# Patient Record
Sex: Male | Born: 1975 | Race: White | Hispanic: No | Marital: Married | Smoking: Never smoker
Health system: Southern US, Community
[De-identification: ages and names within clinical notes are randomized; demographics above are authoritative.]

## PROBLEM LIST (undated history)

## (undated) DIAGNOSIS — F410 Panic disorder [episodic paroxysmal anxiety] without agoraphobia: Secondary | ICD-10-CM

## (undated) DIAGNOSIS — Z87442 Personal history of urinary calculi: Secondary | ICD-10-CM

## (undated) DIAGNOSIS — N451 Epididymitis: Secondary | ICD-10-CM

## (undated) DIAGNOSIS — N200 Calculus of kidney: Secondary | ICD-10-CM

## (undated) HISTORY — DX: Calculus of kidney: N20.0

## (undated) HISTORY — PX: WISDOM TOOTH EXTRACTION: SHX21

## (undated) HISTORY — PX: COLONOSCOPY: SHX174

## (undated) HISTORY — DX: Epididymitis: N45.1

## (undated) HISTORY — PX: VASECTOMY: SHX75

## (undated) HISTORY — DX: Panic disorder (episodic paroxysmal anxiety): F41.0

## (undated) HISTORY — PX: POLYPECTOMY: SHX149

---

## 2015-07-06 ENCOUNTER — Other Ambulatory Visit: Payer: Self-pay | Admitting: Internal Medicine

## 2015-07-06 DIAGNOSIS — R079 Chest pain, unspecified: Secondary | ICD-10-CM

## 2015-07-06 DIAGNOSIS — R1031 Right lower quadrant pain: Secondary | ICD-10-CM

## 2015-07-08 ENCOUNTER — Ambulatory Visit
Admission: RE | Admit: 2015-07-08 | Discharge: 2015-07-08 | Disposition: A | Discharge status: Home / Self Care | Payer: BLUE CROSS/BLUE SHIELD | Source: Ambulatory Visit | Attending: Internal Medicine | Admitting: Internal Medicine

## 2015-07-08 DIAGNOSIS — R079 Chest pain, unspecified: Secondary | ICD-10-CM

## 2015-07-08 DIAGNOSIS — R1031 Right lower quadrant pain: Secondary | ICD-10-CM

## 2015-07-08 MED ORDER — IOPAMIDOL (ISOVUE-300) INJECTION 61%
100.0000 mL | Freq: Once | INTRAVENOUS | Status: AC | PRN
  Administered 2015-07-08: 100 mL via INTRAVENOUS

## 2016-05-08 DIAGNOSIS — N390 Urinary tract infection, site not specified: Secondary | ICD-10-CM | POA: Diagnosis not present

## 2016-05-08 DIAGNOSIS — Z0000 Encounter for general adult medical examination without abnormal findings: Secondary | ICD-10-CM | POA: Diagnosis not present

## 2016-05-08 DIAGNOSIS — R8290 Unspecified abnormal findings in urine: Secondary | ICD-10-CM | POA: Diagnosis not present

## 2016-05-14 DIAGNOSIS — R030 Elevated blood-pressure reading, without diagnosis of hypertension: Secondary | ICD-10-CM | POA: Diagnosis not present

## 2016-05-14 DIAGNOSIS — N451 Epididymitis: Secondary | ICD-10-CM | POA: Diagnosis not present

## 2016-05-14 DIAGNOSIS — Z0000 Encounter for general adult medical examination without abnormal findings: Secondary | ICD-10-CM | POA: Diagnosis not present

## 2016-05-14 DIAGNOSIS — R1012 Left upper quadrant pain: Secondary | ICD-10-CM | POA: Diagnosis not present

## 2016-05-14 DIAGNOSIS — R0789 Other chest pain: Secondary | ICD-10-CM | POA: Diagnosis not present

## 2016-05-15 ENCOUNTER — Encounter: Payer: Self-pay | Admitting: Internal Medicine

## 2016-06-22 ENCOUNTER — Encounter: Payer: Self-pay | Admitting: *Deleted

## 2016-07-24 ENCOUNTER — Encounter: Payer: Self-pay | Admitting: Internal Medicine

## 2016-07-24 ENCOUNTER — Encounter (INDEPENDENT_AMBULATORY_CARE_PROVIDER_SITE_OTHER): Payer: Self-pay

## 2016-07-24 ENCOUNTER — Ambulatory Visit (INDEPENDENT_AMBULATORY_CARE_PROVIDER_SITE_OTHER): Payer: BLUE CROSS/BLUE SHIELD | Admitting: Internal Medicine

## 2016-07-24 VITALS — BP 120/82 | HR 72 | Ht 72.0 in | Wt 183.0 lb

## 2016-07-24 DIAGNOSIS — Z8371 Family history of colonic polyps: Secondary | ICD-10-CM

## 2016-07-24 DIAGNOSIS — Z800 Family history of malignant neoplasm of digestive organs: Secondary | ICD-10-CM | POA: Diagnosis not present

## 2016-07-24 MED ORDER — NA SULFATE-K SULFATE-MG SULF 17.5-3.13-1.6 GM/177ML PO SOLN: ORAL | 0 refills | Status: DC

## 2016-07-24 NOTE — Patient Instructions (Signed)
You have been scheduled for a colonoscopy. Please follow written instructions given to you at your visit today.  Please pick up your prep supplies at the pharmacy within the next 1-3 days. If you use inhalers (even only as needed), please bring them with you on the day of your procedure. Your physician has requested that you go to www.startemmi.com and enter the access code given to you at your visit today. This web site gives a general overview about your procedure. However, you should still follow specific instructions given to you by our office regarding your preparation for the procedure.  If you are age 3 or older, your body mass index should be between 23-30. Your Body mass index is 24.82 kg/m. If this is out of the aforementioned range listed, please consider follow up with your Primary Care Provider.  If you are age 71 or younger, your body mass index should be between 19-25. Your Body mass index is 24.82 kg/m. If this is out of the aformentioned range listed, please consider follow up with your Primary Care Provider.

## 2016-07-26 NOTE — Progress Notes (Signed)
Patient ID: Joshua Smith, male   DOB: Feb 15, 1976, 40 y.o.   MRN: [id number] HPI: Joshua Smith is a 40 year old male with a strong family history of colon cancer and colon polyps, personal history of kidney stones who is seen in consultation at the request of Dr. Brigitte Pulse to consider screening colonoscopy. He is here alone today. He reports he feels well and has very few medical issues. He has had kidney stones in the past but never passed a stone. He occasionally feels right lower quadrant abdominal discomfort which has come and gone over the last several years. Can last for a few days but be absent for many months. Not currently present. Normal bowel habits without blood in his stool or melena. Good appetite. Stable weight. No heartburn, dysphagia or odynophagia. He remains quite active both running and playing golf. Works as a Network engineer. No tobacco, social alcohol. Married with 4 kids.  He had a CT scan of the chest abdomen and pelvis performed last year which I reviewed. This was largely unremarkable except for bilateral small kidney stones and benign appearing renal cysts  Regarding his family history he reports his maternal grandfather had colon cancer, his maternal great-grandfather had colon cancer, a maternal first cousin had colon cancer, maternal second cousin had colon cancer. Both of his parents of had colon polyps. His father's grandparents had colon cancer.  Past Medical History:  Diagnosis Date  . Epididymitis    intermittent  . Kidney stones     Past Surgical History:  Procedure Laterality Date  . WISDOM TOOTH EXTRACTION      No outpatient prescriptions prior to visit.   No facility-administered medications prior to visit.     No Known Allergies  Family History  Problem Relation Age of Onset  . Colon cancer Maternal Grandfather   . Colon polyps Mother   . Atrial fibrillation Father   . Colon polyps Father   . Pulmonary embolism Father   . Arrhythmia  Paternal Grandmother   . CVA Paternal Grandmother   . Heart attack Paternal Grandfather     Social History  Substance Use Topics  . Smoking status: Never Smoker  . Smokeless tobacco: Never Used  . Alcohol use 4.8 oz/week    8 Cans of beer per week    ROS: As per history of present illness, otherwise negative  BP 120/82 (BP Location: Left Arm, Patient Position: Sitting, Cuff Size: Normal)   Pulse 72   Ht 6' (1.829 m)   Wt 183 lb (83 kg)   BMI 24.82 kg/m  Constitutional: Well-developed and well-nourished. No distress. HEENT: Normocephalic and atraumatic. Oropharynx is clear and moist. No oropharyngeal exudate. Conjunctivae are normal.  No scleral icterus. Neck: Neck supple. Trachea midline. Cardiovascular: Normal rate, regular rhythm and intact distal pulses. No M/R/G Pulmonary/chest: Effort normal and breath sounds normal. No wheezing, rales or rhonchi. Abdominal: Soft, mild RLQ tenderness without rebound or guarding, nondistended. Bowel sounds active throughout. There are no masses palpable. No hepatosplenomegaly. Extremities: no clubbing, cyanosis, or edema Lymphadenopathy: No cervical adenopathy noted. Neurological: Alert and oriented to person place and time. Skin: Skin is warm and dry. No rashes noted. Psychiatric: Normal mood and affect. Behavior is normal.  RELEVANT IMAGING: CLINICAL DATA:  Right-sided chest pain and right lower quadrant abdominal pain for 1 month. History of renal calculi.   EXAM: CT CHEST, ABDOMEN, AND PELVIS WITH CONTRAST   TECHNIQUE: Multidetector CT imaging of the chest, abdomen and pelvis was performed following the standard  protocol during bolus administration of intravenous contrast.   CONTRAST:  127mL ISOVUE-300 IOPAMIDOL (ISOVUE-300) INJECTION 61%   COMPARISON:  Renal ultrasound at [facility] on 09/12/2012   FINDINGS: CT CHEST FINDINGS   The chest demonstrates no evidence of pneumothorax, pulmonary consolidation, edema or  pulmonary nodule. No airway obstruction is identified. No air trapping. No pleural fluid identified.   No supraclavicular, mediastinal, hilar or axillary lymphadenopathy is seen. There is focal calcification and 1 or more nonenlarged lower right peritracheal lymph nodes just above the carina. This is consistent with prior granulomatous disease. The thoracic aorta is normal. The heart size is normal. No pericardial fluid.   The thyroid gland appears normal. Bony structures are within normal limits.   CT ABDOMEN AND PELVIS FINDINGS   The liver, gallbladder, pancreas, spleen, adrenal glands and bowel are within normal limits. The appendix is visualized and normal with no evidence of appendicitis.   Small nonobstructing renal calculi are identified bilaterally. Lower pole calculus of the right kidney measures 5 mm in greatest diameter. Lower pole calculus in the left kidney measures 8 mm in greatest diameter. No hydronephrosis, ureteral calculi or bladder abnormalities. Cyst of the mid to lower left kidney shows simple internal fluid density and measures approximately 5.3 cm in greatest diameter. Small cyst at the posterior tip of the lower pole of the right kidney measures 9 mm and shows simple internal fluid density.   No evidence of masses or lymphadenopathy. Vascular structures are normal. No hernias or enlarged lymph nodes are seen. No evidence of bowel obstruction, inflammation, free fluid, free air or focal abscess. Bony structures are normal.   IMPRESSION: 1. Unremarkable CT of the chest. Focal lymph node calcification in the right lower paratracheal region of the mediastinum is consistent with prior granulomatous disease. 2. Nonobstructing bilateral renal calculi with 5 mm lower pole calculus on the right and 8 mm lower pole calculus on the left. No ureteral or bladder calculi. No hydronephrosis. 3. Bilateral renal cysts have a benign appearance. 4. No acute findings in  the abdomen or pelvis. No evidence of appendicitis.     Electronically Signed   By: Aletta Edouard M.D.   On: 07/08/2015 15:43    ASSESSMENT/PLAN:  40 year old male with a strong family history of colon cancer and colon polyps, personal history of kidney stones who is seen in consultation at the request of Dr. Brigitte Pulse to consider screening colonoscopy.  1. Family history of colon cancer and colon polyps -- I have definitely recommended proceeding with screening colonoscopy at this time given his strong family history of colon cancer and colon polyps. Colon polyps in first degree relatives, colon cancer in multiple second degree relatives. We discussed the risks, benefits and alternatives to colonoscopy and he wishes to proceed.      [id number] [address] [place], [place] [address]

## 2016-09-21 ENCOUNTER — Encounter: Payer: Self-pay | Admitting: Internal Medicine

## 2016-10-01 ENCOUNTER — Encounter: Payer: Self-pay | Admitting: Internal Medicine

## 2016-10-01 ENCOUNTER — Ambulatory Visit (AMBULATORY_SURGERY_CENTER): Payer: BLUE CROSS/BLUE SHIELD | Admitting: Internal Medicine

## 2016-10-01 VITALS — BP 93/59 | HR 40 | Temp 98.9°F | Resp 15 | Ht 72.0 in | Wt 183.0 lb

## 2016-10-01 DIAGNOSIS — Z1212 Encounter for screening for malignant neoplasm of rectum: Secondary | ICD-10-CM | POA: Diagnosis not present

## 2016-10-01 DIAGNOSIS — D128 Benign neoplasm of rectum: Secondary | ICD-10-CM | POA: Diagnosis not present

## 2016-10-01 DIAGNOSIS — Z800 Family history of malignant neoplasm of digestive organs: Secondary | ICD-10-CM | POA: Diagnosis not present

## 2016-10-01 DIAGNOSIS — Z1211 Encounter for screening for malignant neoplasm of colon: Secondary | ICD-10-CM

## 2016-10-01 DIAGNOSIS — D125 Benign neoplasm of sigmoid colon: Secondary | ICD-10-CM | POA: Diagnosis not present

## 2016-10-01 DIAGNOSIS — D129 Benign neoplasm of anus and anal canal: Secondary | ICD-10-CM

## 2016-10-01 MED ORDER — SODIUM CHLORIDE 0.9 % IV SOLN: 500.0000 mL | INTRAVENOUS | Status: AC

## 2016-10-01 NOTE — Op Note (Signed)
[place] Patient Name: Joshua Smith Procedure Date: 10/01/2016 10:57 AM MRN: [id number] Endoscopist: Jerene Bears , MD Age: 40 Referring MD:  Date of Birth: December 29, 1975 Gender: Male Account #: [id number] Procedure:                Colonoscopy Indications:              Colon cancer screening in patient at increased                            risk: Family history of colon polyps in multiple                            1st-degree relatives, Colon cancer screening in                            patient at increased risk: Family history of                            colorectal cancer in multiple 2nd degree relatives,                            This is the patient's first colonoscopy Medicines:                Monitored Anesthesia Care Procedure:                Pre-Anesthesia Assessment:                           - Prior to the procedure, a History and Physical                            was performed, and patient medications and                            allergies were reviewed. The patient's tolerance of                            previous anesthesia was also reviewed. The risks                            and benefits of the procedure and the sedation                            options and risks were discussed with the patient.                            All questions were answered, and informed consent                            was obtained. Prior Anticoagulants: The patient has                            taken no previous anticoagulant or antiplatelet  agents. ASA Grade Assessment: I - A normal, healthy                            patient. After reviewing the risks and benefits,                            the patient was deemed in satisfactory condition to                            undergo the procedure.                           After obtaining informed consent, the colonoscope                            was passed under direct vision.  Throughout the                            procedure, the patient's blood pressure, pulse, and                            oxygen saturations were monitored continuously. The                            Model PCF-H190DL [id number]) scope was introduced                            through the anus and advanced to the the cecum,                            identified by appendiceal orifice and ileocecal                            valve. The colonoscopy was performed without                            difficulty. The patient tolerated the procedure                            well. The quality of the bowel preparation was                            excellent. The ileocecal valve, appendiceal                            orifice, and rectum were photographed. The bowel                            preparation used was SUPREP. Scope In: 11:01:29 AM Scope Out: 11:23:21 AM Scope Withdrawal Time: 0 hours 16 minutes 58 seconds  Total Procedure Duration: 0 hours 21 minutes 52 seconds  Findings:                 The perianal exam findings include non-thrombosed  external hemorrhoids.                           A 18 mm polyp was found in the distal sigmoid                            colon. The polyp was semi-pedunculated. The polyp                            was removed with a hot snare. Resection and                            retrieval were complete. Biopsies were performed                            around the polypectomy based with a cold forceps to                            ensure complete resection.                           A 7 mm polyp was found in the rectum. The polyp was                            sessile. The polyp was removed with a hot snare.                            Resection and retrieval were complete.                           The exam was otherwise without abnormality.                           External hemorrhoids were found during retroflexion                             and during perianal exam. The hemorrhoids were                            small. Complications:            No immediate complications. Estimated Blood Loss:     Estimated blood loss was minimal. Impression:               - One 18 mm polyp in the distal sigmoid colon,                            removed with a hot snare. Resected and retrieved.                            Biopsied.                           - One 7 mm polyp in the rectum, removed with a hot  snare. Resected and retrieved.                           - The examination was otherwise normal.                           - Small external hemorrhoids. Recommendation:           - Patient has a contact number available for                            emergencies. The signs and symptoms of potential                            delayed complications were discussed with the                            patient. Return to normal activities tomorrow.                            Written discharge instructions were provided to the                            patient.                           - Resume previous diet.                           - Continue present medications.                           - Await pathology results.                           - Repeat colonoscopy is recommended for                            surveillance. The colonoscopy date will be                            determined after pathology results from today's                            exam become available for review.                           - No aspirin, ibuprofen, naproxen, or other                            non-steroidal anti-inflammatory drugs for 3 weeks                            after polyp removal. Jerene Bears, MD 10/01/2016 11:30:49 AM This report has been signed electronically.

## 2016-10-01 NOTE — Progress Notes (Signed)
No egg or soy allergy known to patient  No home 02 use per patient  No blood thinners per patient  Pt denies issues with constipation  No A fib or A flutter   with Iv sticks 1 and 2 both unsuccessful. After 2nd attempt to right [facility], pt states " im sweating "  and then proceeded to pass out.  Pt very pale in color and not alert, after a few swconds pt awake and talking, placed in trendelenberg after episode, cool cloth for forehead, pt recovered well, un aware of event, Iv started in left AV with no issues . Pt tolerated well. Md made aware, no further orders.

## 2016-10-01 NOTE — Progress Notes (Signed)
Spontaneous respirations throughout. VSS. Resting comfortably. To PACU on room air. Report to  [facility].

## 2016-10-01 NOTE — Patient Instructions (Signed)
Impression/recommendations:  Polyp (handout given) Hemorrhoids (handout given)  No aspirin, ibuprofen, naproxen or other non-steroidal anti-inflammatory drugs for 3 weeks until 10/23/16. Tylenol only if needed.  YOU HAD AN ENDOSCOPIC PROCEDURE TODAY AT [facility]:   Refer to the procedure report that was given to you for any specific questions about what was found during the examination.  If the procedure report does not answer your questions, please call your gastroenterologist to clarify.  If you requested that your care partner not be given the details of your procedure findings, then the procedure report has been included in a sealed envelope for you to review at your convenience later.  YOU SHOULD EXPECT: Some feelings of bloating in the abdomen. Passage of more gas than usual.  Walking can help get rid of the air that was put into your GI tract during the procedure and reduce the bloating. If you had a lower endoscopy (such as a colonoscopy or flexible sigmoidoscopy) you may notice spotting of blood in your stool or on the toilet paper. If you underwent a bowel prep for your procedure, you may not have a normal bowel movement for a few days.  Please Note:  You might notice some irritation and congestion in your nose or some drainage.  This is from the oxygen used during your procedure.  There is no need for concern and it should clear up in a day or so.  SYMPTOMS TO REPORT IMMEDIATELY:   Following lower endoscopy (colonoscopy or flexible sigmoidoscopy):  Excessive amounts of blood in the stool  Significant tenderness or worsening of abdominal pains  Swelling of the abdomen that is new, acute  Fever of 100F or higher  For urgent or emergent issues, a gastroenterologist can be reached at any hour by calling [phone].   DIET:  We do recommend a small meal at first, but then you may proceed to your regular diet.  Drink plenty of fluids but you should avoid  alcoholic beverages for 24 hours.  ACTIVITY:  You should plan to take it easy for the rest of today and you should NOT DRIVE or use heavy machinery until tomorrow (because of the sedation medicines used during the test).    FOLLOW UP: Our staff will call the number listed on your records the next business day following your procedure to check on you and address any questions or concerns that you may have regarding the information given to you following your procedure. If we do not reach you, we will leave a message.  However, if you are feeling well and you are not experiencing any problems, there is no need to return our call.  We will assume that you have returned to your regular daily activities without incident.  If any biopsies were taken you will be contacted by phone or by letter within the next 1-3 weeks.  Please call us at [phone] if you have not heard about the biopsies in 3 weeks.    SIGNATURES/CONFIDENTIALITY: You and/or your care partner have signed paperwork which will be entered into your electronic medical record.  These signatures attest to the fact that that the information above on your After Visit Summary has been reviewed and is understood.  Full responsibility of the confidentiality of this discharge information lies with you and/or your care-partner.

## 2016-10-01 NOTE — Progress Notes (Signed)
Called to room to assist during endoscopic procedure.  Patient ID and intended procedure confirmed with present staff. Received instructions for my participation in the procedure from the performing physician.  

## 2016-10-02 ENCOUNTER — Telehealth: Payer: Self-pay

## 2016-10-02 NOTE — Telephone Encounter (Signed)
Left a message for the pt on [phone] to call us back if any questions or concerns. maw

## 2016-10-02 NOTE — Telephone Encounter (Signed)
  Follow up Call-  Call back number 10/01/2016  Post procedure Call Back phone  # [phone]  Permission to leave phone message Yes  Some recent data might be hidden     Patient questions:  Do you have a fever, pain , or abdominal swelling? No. Pain Score  0 *  Have you tolerated food without any problems? Yes.    Have you been able to return to your normal activities? Yes.    Do you have any questions about your discharge instructions: Diet   No. Medications  No. Follow up visit  No.  Do you have questions or concerns about your Care? No.  Actions: * If pain score is 4 or above: No action needed, pain <4.  Follow up Call-  Call back number 10/01/2016  Post procedure Call Back phone  # [phone]  Permission to leave phone message Yes  Some recent data might be hidden     Patient questions:  Do you have a fever, pain , or abdominal swelling? No. Pain Score  0 *  Have you tolerated food without any problems? Yes.    Have you been able to return to your normal activities? Yes.    Do you have any questions about your discharge instructions: Diet   Yes.   Medications  Yes.   Follow up visit  Yes.    Do you have questions or concerns about your Care? No.  Actions: * If pain score is 4 or above: No action needed, pain <4.

## 2016-10-04 ENCOUNTER — Encounter: Payer: Self-pay | Admitting: Internal Medicine

## 2017-01-29 IMAGING — CT CT ABD-PELV W/ CM
2 of 4 series · 14 of 46 positions shown, 16 images · IV contrast (APPLIED)
Comparison: Renal ultrasound at [HOSPITAL] on 09/12/2012

CLINICAL DATA: Right-sided chest pain and right lower quadrant
abdominal pain for 1 month. History of renal calculi.

EXAM:
CT CHEST, ABDOMEN, AND PELVIS WITH CONTRAST
TECHNIQUE: Multidetector CT imaging of the chest, abdomen and pelvis was
performed following the standard protocol during bolus
administration of intravenous contrast.
CONTRAST:  100mL 5WJ5HX-0GG IOPAMIDOL (5WJ5HX-0GG) INJECTION 61%

[Series 3: chest/abd/pelvis w/cm · axial · 0.81mm/px · z∈[-644,+6]mm · 11 of 144 slices shown, 13 images]
[im 7/144  soft-tissue]
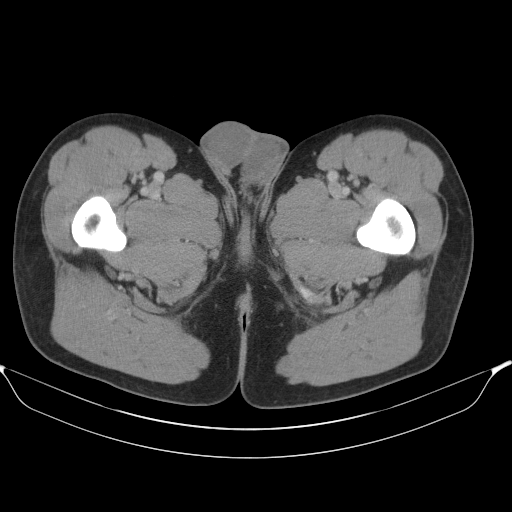
[im 7/144  bone]
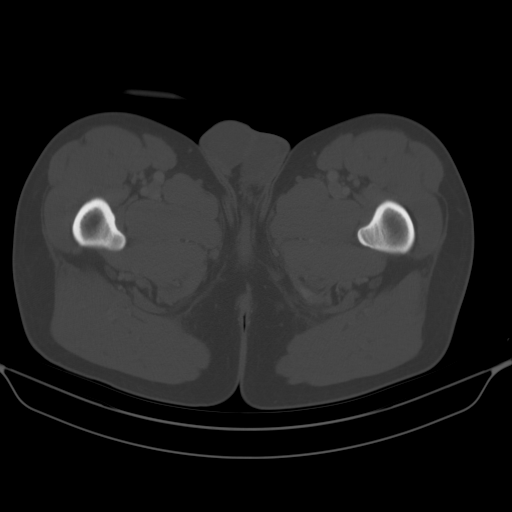
[im 21/144  soft-tissue]
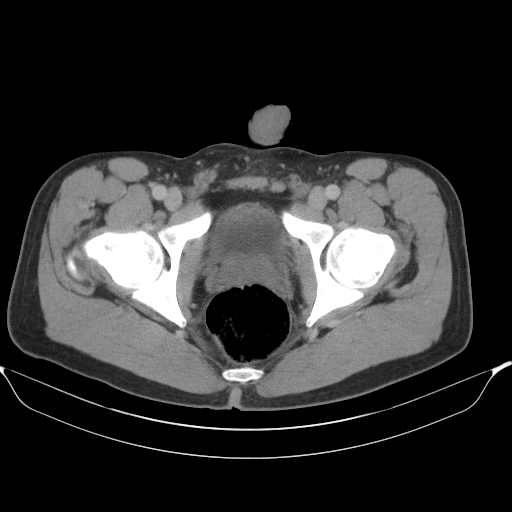
[im 35/144  soft-tissue]
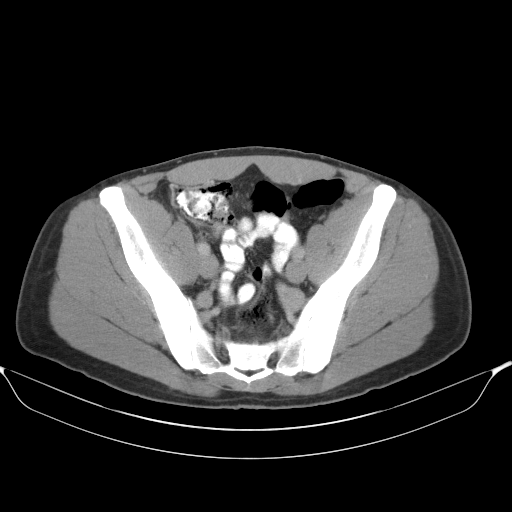
[im 48/144  soft-tissue]
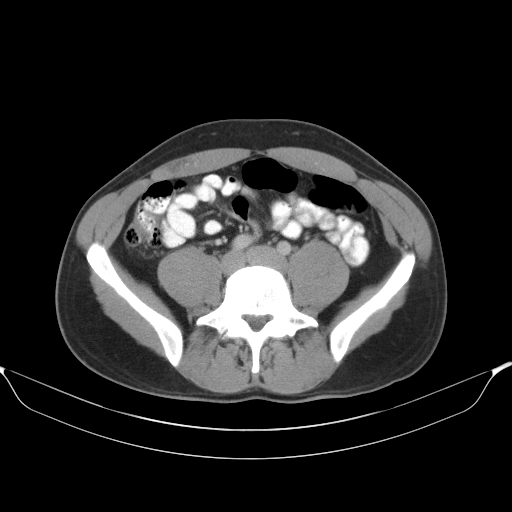
[im 62/144  soft-tissue]
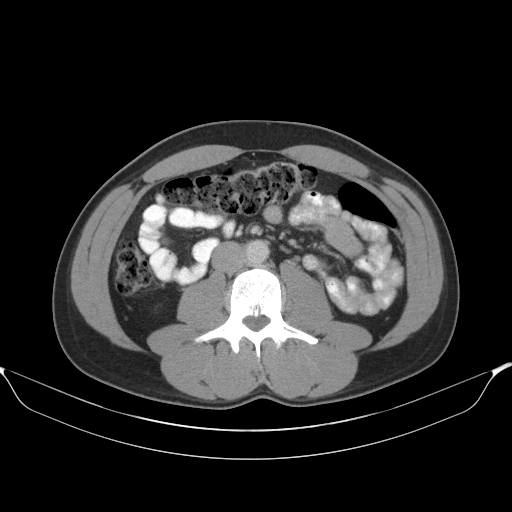
[im 75/144  soft-tissue]
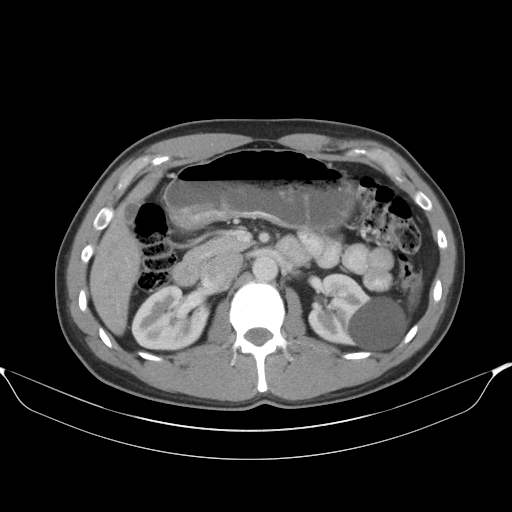
[im 82/144  soft-tissue]
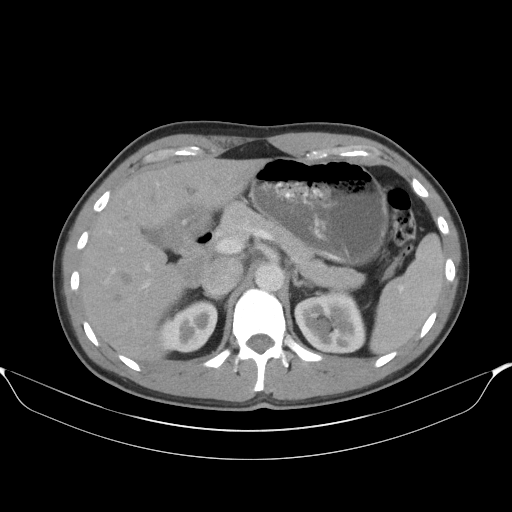
[im 96/144  soft-tissue]
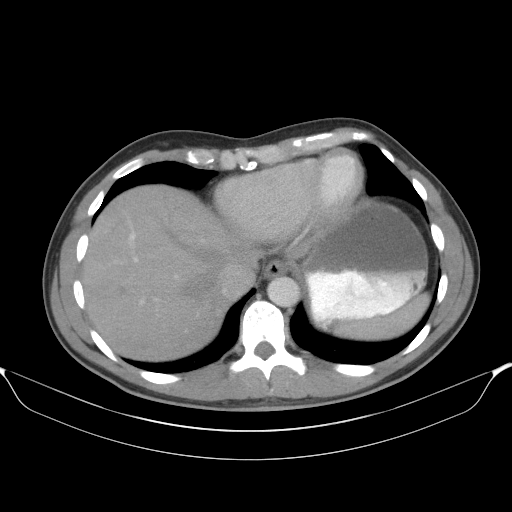
[im 109/144  soft-tissue]
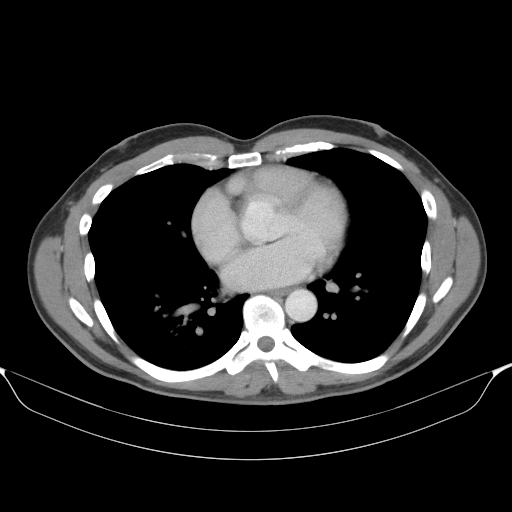
[im 109/144  bone]
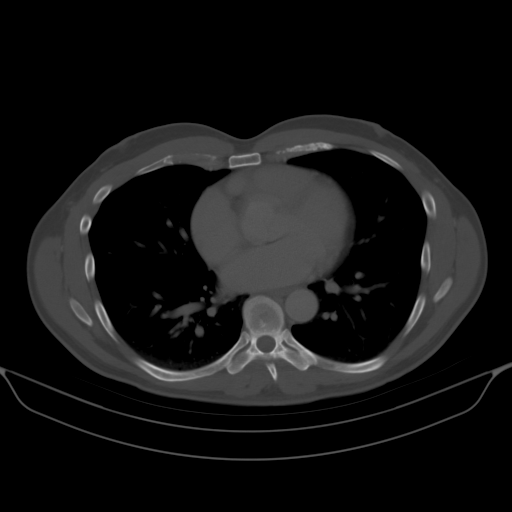
[im 123/144  soft-tissue]
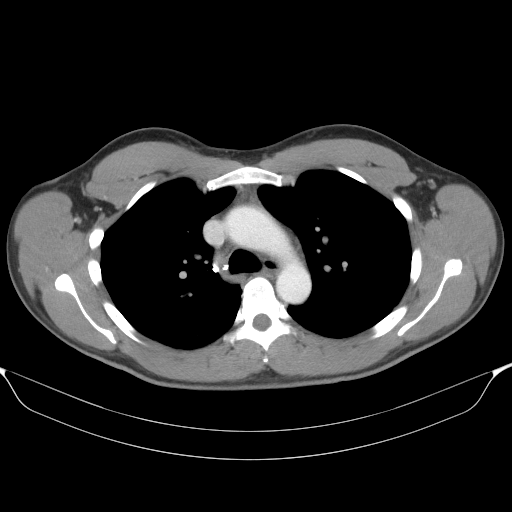
[im 137/144  soft-tissue]
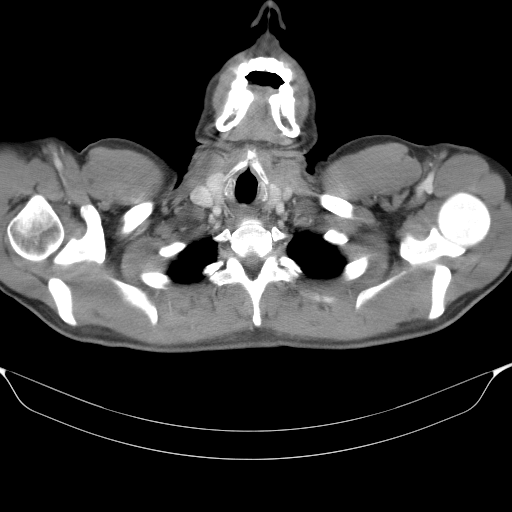

[Series 4: cor · coronal · 0.87mm/px · 3 of 76 slices shown]
[im 26/76  soft-tissue]
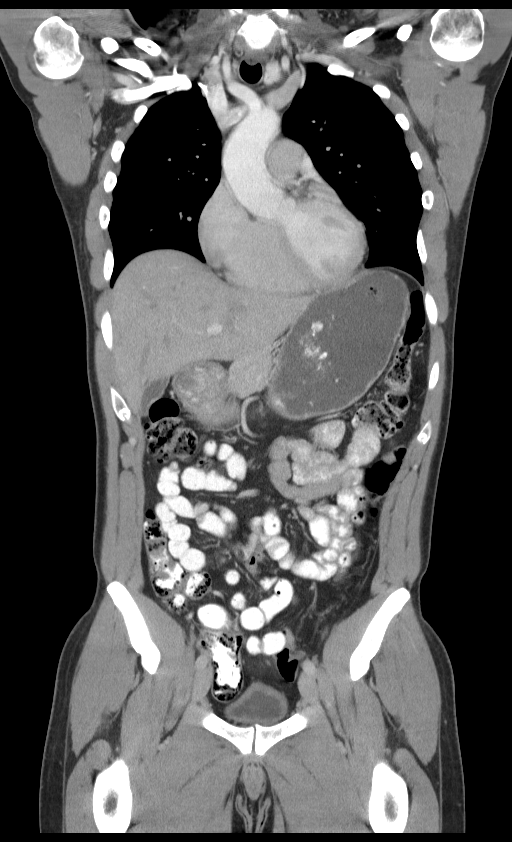
[im 34/76  soft-tissue]
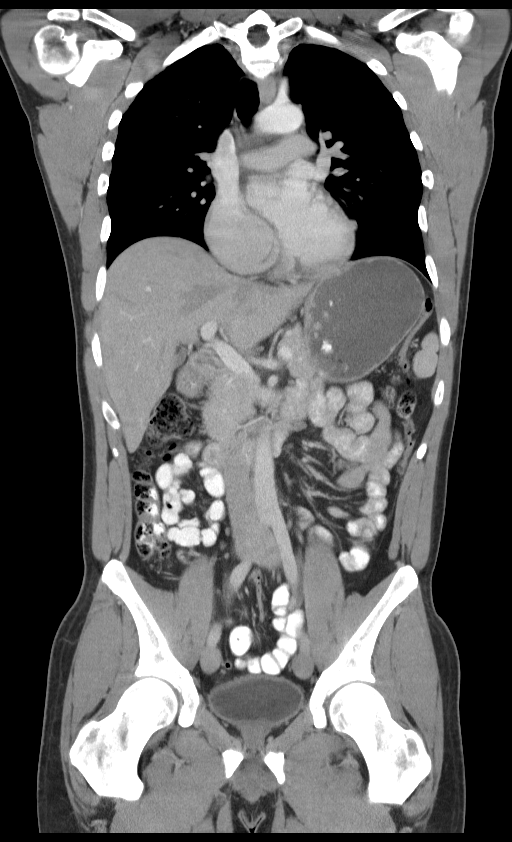
[im 42/76  soft-tissue]
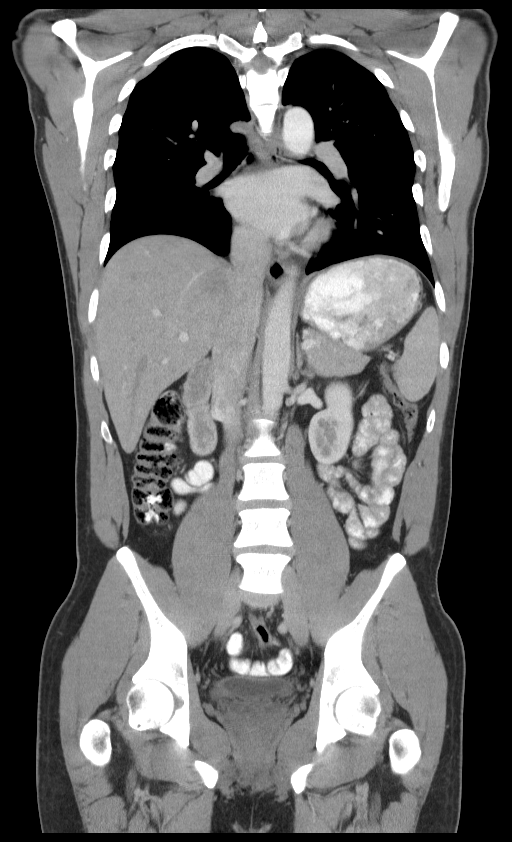

[14 of 46 positions shown; findings below may reference images not displayed]

FINDINGS: CT CHEST FINDINGS

The chest demonstrates no evidence of pneumothorax, pulmonary
consolidation, edema or pulmonary nodule. No airway obstruction is
identified. No air trapping. No pleural fluid identified.

No supraclavicular, mediastinal, hilar or axillary lymphadenopathy
is seen. There is focal calcification and 1 or more nonenlarged
lower right peritracheal lymph nodes just above the carina. This is
consistent with prior granulomatous disease. The thoracic aorta is
normal. The heart size is normal. No pericardial fluid.

The thyroid gland appears normal. Bony structures are within normal
limits.

CT ABDOMEN AND PELVIS FINDINGS

The liver, gallbladder, pancreas, spleen, adrenal glands and bowel
are within normal limits. The appendix is visualized and normal with
no evidence of appendicitis.

Small nonobstructing renal calculi are identified bilaterally. Lower
pole calculus of the right kidney measures 5 mm in greatest
diameter. Lower pole calculus in the left kidney measures 8 mm in
greatest diameter. No hydronephrosis, ureteral calculi or bladder
abnormalities. Cyst of the mid to lower left kidney shows simple
internal fluid density and measures approximately 5.3 cm in greatest
diameter. Small cyst at the posterior tip of the lower pole of the
right kidney measures 9 mm and shows simple internal fluid density.

No evidence of masses or lymphadenopathy. Vascular structures are
normal. No hernias or enlarged lymph nodes are seen. No evidence of
bowel obstruction, inflammation, free fluid, free air or focal
abscess. Bony structures are normal.
IMPRESSION: 1. Unremarkable CT of the chest. Focal lymph node calcification in
the right lower paratracheal region of the mediastinum is consistent
with prior granulomatous disease.
2. Nonobstructing bilateral renal calculi with 5 mm lower pole
calculus on the right and 8 mm lower pole calculus on the left. No
ureteral or bladder calculi. No hydronephrosis.
3. Bilateral renal cysts have a benign appearance.
4. No acute findings in the abdomen or pelvis. No evidence of
appendicitis.

## 2017-05-21 DIAGNOSIS — Z125 Encounter for screening for malignant neoplasm of prostate: Secondary | ICD-10-CM | POA: Diagnosis not present

## 2017-05-21 DIAGNOSIS — Z0000 Encounter for general adult medical examination without abnormal findings: Secondary | ICD-10-CM | POA: Diagnosis not present

## 2017-05-28 DIAGNOSIS — Z0000 Encounter for general adult medical examination without abnormal findings: Secondary | ICD-10-CM | POA: Diagnosis not present

## 2017-05-28 DIAGNOSIS — Z125 Encounter for screening for malignant neoplasm of prostate: Secondary | ICD-10-CM | POA: Diagnosis not present

## 2017-05-28 DIAGNOSIS — Z1389 Encounter for screening for other disorder: Secondary | ICD-10-CM | POA: Diagnosis not present

## 2017-11-27 DIAGNOSIS — R55 Syncope and collapse: Secondary | ICD-10-CM | POA: Diagnosis not present

## 2017-11-27 DIAGNOSIS — J209 Acute bronchitis, unspecified: Secondary | ICD-10-CM | POA: Diagnosis not present

## 2018-08-04 DIAGNOSIS — L03115 Cellulitis of right lower limb: Secondary | ICD-10-CM | POA: Diagnosis not present

## 2018-08-04 DIAGNOSIS — L255 Unspecified contact dermatitis due to plants, except food: Secondary | ICD-10-CM | POA: Diagnosis not present

## 2018-08-12 DIAGNOSIS — L270 Generalized skin eruption due to drugs and medicaments taken internally: Secondary | ICD-10-CM | POA: Diagnosis not present

## 2018-08-12 DIAGNOSIS — Z125 Encounter for screening for malignant neoplasm of prostate: Secondary | ICD-10-CM | POA: Diagnosis not present

## 2018-08-12 DIAGNOSIS — Z6824 Body mass index (BMI) 24.0-24.9, adult: Secondary | ICD-10-CM | POA: Diagnosis not present

## 2018-08-12 DIAGNOSIS — Z0000 Encounter for general adult medical examination without abnormal findings: Secondary | ICD-10-CM | POA: Diagnosis not present

## 2018-08-12 DIAGNOSIS — L508 Other urticaria: Secondary | ICD-10-CM | POA: Diagnosis not present

## 2018-08-19 DIAGNOSIS — R0789 Other chest pain: Secondary | ICD-10-CM | POA: Diagnosis not present

## 2018-08-19 DIAGNOSIS — R310 Gross hematuria: Secondary | ICD-10-CM | POA: Diagnosis not present

## 2018-08-19 DIAGNOSIS — Z0000 Encounter for general adult medical examination without abnormal findings: Secondary | ICD-10-CM | POA: Diagnosis not present

## 2018-08-19 DIAGNOSIS — Z1389 Encounter for screening for other disorder: Secondary | ICD-10-CM | POA: Diagnosis not present

## 2018-08-19 DIAGNOSIS — L509 Urticaria, unspecified: Secondary | ICD-10-CM | POA: Diagnosis not present

## 2018-08-19 DIAGNOSIS — Z86010 Personal history of colonic polyps: Secondary | ICD-10-CM | POA: Diagnosis not present

## 2018-09-24 ENCOUNTER — Ambulatory Visit: Payer: Self-pay | Admitting: Allergy

## 2018-09-28 NOTE — Progress Notes (Signed)
Cardiology Office Note   Date:  09/29/2018   ID:  Joshua Smith, DOB [birth year], MRN [id number]  PCP:  Marton Redwood, MD  Cardiologist:   No primary care provider on file. Referring:  Marton Redwood, MD  Chief Complaint  Patient presents with  . Chest Pain      History of Present Illness: Joshua Smith is a 42 y.o. male who is referred by Marton Redwood, MD for evaluation of chest pain.  This is been going on and off for 6 months.  He notices it occasionally with more emotional stress than with exercise.  He runs about 3 to 5 miles most days.  He does not necessarily get symptoms with this.  He does not describe shortness of breath.  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  Of note he had a syncopal episode last year after he hit his knee while on an exercise bike.  He felt presyncopal an hour afterwards as well.  He was attended to by some physicians at the gym where he was working out.  He mentions some discomfort last year to Dr. Brigitte Pulse but he felt to cardiology referral.  He has not had any further syncope since then.  He does not feel palpitations.  He has no PND or orthopnea.  He said no weight gain or edema.    Past Medical History:  Diagnosis Date  . Epididymitis    intermittent  . Kidney stones   . Panic attacks     Past Surgical History:  Procedure Laterality Date  . VASECTOMY    . WISDOM TOOTH EXTRACTION       No current outpatient medications on file.   Current Facility-Administered Medications  Medication Dose Route Frequency Provider Last Rate Last Dose  . 0.9 %  sodium chloride infusion  500 mL Intravenous Continuous Pyrtle, Lajuan Lines, MD        Allergies:   Patient has no known allergies.    Social History:  The patient  reports that he has never smoked. He has never used smokeless tobacco. He reports that he drinks about 8.0 standard drinks of alcohol  per week. He reports that he does not use drugs.   Family History:  The patient's family history includes Arrhythmia in his paternal grandmother; Atrial fibrillation in his father; CVA in his paternal grandmother; Colon cancer in his maternal grandfather; Colon polyps in his father and mother; Heart attack (age of onset: 53) in his paternal grandfather; Pulmonary embolism in his father.    ROS:  Please see the history of present illness.   Otherwise, review of systems are positive for history of hematuria.   All other systems are reviewed and negative.    PHYSICAL EXAM: VS:  Pulse (!) 45   Ht 6' (1.829 m)   Wt 190 lb 9.6 oz (86.5 kg)   BMI 25.85 kg/m  , BMI Body mass index is 25.85 kg/m. GENERAL:  Well appearing HEENT:  Pupils equal round and reactive, fundi not visualized, oral mucosa unremarkable NECK:  No jugular venous distention, waveform within normal limits, carotid upstroke brisk and symmetric, no bruits, no thyromegaly LYMPHATICS:  No cervical, inguinal adenopathy LUNGS:  Clear to auscultation bilaterally BACK:  No CVA tenderness CHEST:  Unremarkable HEART:  PMI not displaced or sustained,S1 and S2 within normal limits, no S3, no S4, no clicks, no rubs, no murmurs ABD:  Flat, positive bowel sounds normal in frequency in pitch, no bruits, no rebound, no guarding, no midline pulsatile mass, no hepatomegaly, no splenomegaly EXT:  2 plus pulses throughout, no edema, no cyanosis no clubbing SKIN:  No rashes no nodules NEURO:  Cranial nerves II through XII grossly intact, motor grossly intact throughout PSYCH:  Cognitively intact, oriented to person place and time    EKG:  EKG is ordered today. The ekg ordered today demonstrates sinus bradycardia, rate 45, axis within normal limits, intervals within normal limits, early repolarization pattern.   Recent Labs: No results found for requested labs within last 8760 hours.    Lipid Panel No results found for: CHOL, TRIG, HDL,  CHOLHDL, VLDL, LDLCALC, LDLDIRECT    Wt Readings from Last 3 Encounters:  09/29/18 190 lb 9.6 oz (86.5 kg)  10/01/16 183 lb (83 kg)  07/24/16 183 lb (83 kg)      Other studies Reviewed: Additional studies/ records that were reviewed today include: Labs. Review of the above records demonstrates:  Please see elsewhere in the note.     ASSESSMENT AND PLAN:  CHEST PAIN: The pretest probability of obstructive coronary disease is somewhat low.  However, given this story and some family history I will order a coronary calcium score and POET (Plain Old Exercise Treadmill).  Further evaluation will be based on these results.  SYNCOPE: He had one episode of this sometime last year.  Is had no further events.  No further testing is indicated.   Current medicines are reviewed at length with the patient today.  The patient does not have concerns regarding medicines.  The following changes have been made:  no change  Labs/ tests ordered today include:   Orders Placed This Encounter  Procedures  . CT CARDIAC SCORING  . EXERCISE TOLERANCE TEST (ETT)  . EKG 12-Lead     Disposition:   FU with me as needed     Signed, Minus Breeding, MD  09/29/2018 5:42 PM    [facility]

## 2018-09-29 ENCOUNTER — Encounter: Payer: Self-pay | Admitting: Cardiology

## 2018-09-29 ENCOUNTER — Ambulatory Visit (INDEPENDENT_AMBULATORY_CARE_PROVIDER_SITE_OTHER): Payer: BLUE CROSS/BLUE SHIELD | Admitting: Cardiology

## 2018-09-29 VITALS — HR 45 | Ht 72.0 in | Wt 190.6 lb

## 2018-09-29 DIAGNOSIS — R079 Chest pain, unspecified: Secondary | ICD-10-CM | POA: Diagnosis not present

## 2018-09-29 DIAGNOSIS — R55 Syncope and collapse: Secondary | ICD-10-CM | POA: Diagnosis not present

## 2018-09-29 NOTE — Patient Instructions (Signed)
Medication Instructions:  Continue current medications  If you need a refill on your cardiac medications before your next appointment, please call your pharmacy.  Labwork: None Ordered   If you have labs (blood work) drawn today and your tests are completely normal, you will receive your results only by: Marland Kitchen MyChart Message (if you have MyChart) OR . A paper copy in the mail If you have any lab test that is abnormal or we need to change your treatment, we will call you to review the results.  Testing/Procedures: Your physician has requested that you have a Coronary Calcium Score Test. This test is done at our [facility]  Your physician has requested that you have an exercise tolerance test. For further information please visit [email or web address]. Please also follow instruction sheet, as given.   Follow-Up: . You will need a follow up appointment in As Needed.     At [facility], you and your health needs are our priority.  As part of our continuing mission to provide you with exceptional heart care, we have created designated Provider Care Teams.  These Care Teams include your primary Cardiologist (physician) and Advanced Practice Providers (APPs -  Physician Assistants and Nurse Practitioners) who all work together to provide you with the care you need, when you need it.   Thank you for choosing [facility] at [facility]!!

## 2018-09-30 ENCOUNTER — Telehealth (HOSPITAL_COMMUNITY): Payer: Self-pay

## 2018-09-30 NOTE — Telephone Encounter (Signed)
Encounter complete. 

## 2018-10-01 ENCOUNTER — Telehealth (HOSPITAL_COMMUNITY): Payer: Self-pay

## 2018-10-01 NOTE — Telephone Encounter (Signed)
Encounter complete. 

## 2018-10-02 ENCOUNTER — Ambulatory Visit (HOSPITAL_COMMUNITY)
Admission: RE | Admit: 2018-10-02 | Discharge: 2018-10-02 | Disposition: A | Discharge status: Home / Self Care | Payer: BLUE CROSS/BLUE SHIELD | Source: Ambulatory Visit | Attending: Cardiovascular Disease | Admitting: Cardiovascular Disease

## 2018-10-02 DIAGNOSIS — R079 Chest pain, unspecified: Secondary | ICD-10-CM

## 2018-10-02 LAB — EXERCISE TOLERANCE TEST
CHL CUP MPHR: 178 {beats}/min
CSEPHR: 89 %
Estimated workload: 20 METS
Exercise duration (min): 17 min
Exercise duration (sec): 10 s
Peak HR: 160 {beats}/min
RPE: 19
Rest HR: 48 {beats}/min

## 2018-10-09 ENCOUNTER — Ambulatory Visit (INDEPENDENT_AMBULATORY_CARE_PROVIDER_SITE_OTHER)
Admission: RE | Admit: 2018-10-09 | Discharge: 2018-10-09 | Disposition: A | Discharge status: Home / Self Care | Payer: BLUE CROSS/BLUE SHIELD | Source: Ambulatory Visit | Attending: Cardiology | Admitting: Cardiology

## 2018-10-09 DIAGNOSIS — R079 Chest pain, unspecified: Secondary | ICD-10-CM

## 2019-07-07 ENCOUNTER — Telehealth: Payer: BLUE CROSS/BLUE SHIELD | Admitting: Family

## 2019-07-07 ENCOUNTER — Other Ambulatory Visit: Payer: Self-pay

## 2019-07-07 DIAGNOSIS — Z20822 Contact with and (suspected) exposure to covid-19: Secondary | ICD-10-CM

## 2019-07-07 NOTE — Progress Notes (Signed)
E-Visit for Corona Virus Screening   Your current symptoms could be consistent with the coronavirus.  Many health care providers can now test patients at their office but not all are.  [facility] has multiple testing sites. For information on our COVID testing locations and hours go to [email or web address]  Please quarantine yourself while awaiting your test results.  We are enrolling you in our [facility] for [place] . Daily you will receive a questionnaire within the [facility] website. Our COVID 19 response team willl be monitoriing your responses daily.    COVID-19 is a respiratory illness with symptoms that are similar to the flu. Symptoms are typically mild to moderate, but there have been cases of severe illness and death due to the virus. The following symptoms may appear 2-14 days after exposure: . Fever . Cough . Shortness of breath or difficulty breathing . Chills . Repeated shaking with chills . Muscle pain . Headache . Sore throat . New loss of taste or smell . Fatigue . Congestion or runny nose . Nausea or vomiting . Diarrhea  It is vitally important that if you feel that you have an infection such as this virus or any other virus that you stay home and away from places where you may spread it to others.  You should self-quarantine for 14 days if you have symptoms that could potentially be coronavirus or have been in close contact a with a person diagnosed with COVID-19 within the last 2 weeks. You should avoid contact with people age 43 and older.   You should wear a mask or cloth face covering over your nose and mouth if you must be around other people or animals, including pets (even at home). Try to stay at least 6 feet away from other people. This will protect the people around you.  You may also take acetaminophen (Tylenol) as needed for fever.   Reduce your risk of any infection by using the same precautions used for avoiding the  common cold or flu:  Marland Kitchen Wash your hands often with soap and warm water for at least 20 seconds.  If soap and water are not readily available, use an alcohol-based hand sanitizer with at least 60% alcohol.  . If coughing or sneezing, cover your mouth and nose by coughing or sneezing into the elbow areas of your shirt or coat, into a tissue or into your sleeve (not your hands). . Avoid shaking hands with others and consider head nods or verbal greetings only. . Avoid touching your eyes, nose, or mouth with unwashed hands.  . Avoid close contact with people who are sick. . Avoid places or events with large numbers of people in one location, like concerts or sporting events. . Carefully consider travel plans you have or are making. . If you are planning any travel outside or inside the [place], visit the CDC's Travelers' Health webpage for the latest health notices. . If you have some symptoms but not all symptoms, continue to monitor at home and seek medical attention if your symptoms worsen. . If you are having a medical emergency, call 911.  HOME CARE . Only take medications as instructed by your medical team. . Drink plenty of fluids and get plenty of rest. . A steam or ultrasonic humidifier can help if you have congestion.   GET HELP RIGHT AWAY IF YOU HAVE EMERGENCY WARNING SIGNS** FOR COVID-19. If you or someone is showing any of these signs seek emergency medical care immediately. Call  911 or proceed to your closest emergency facility if: . You develop worsening high fever. . Trouble breathing . Bluish lips or face . Persistent pain or pressure in the chest . New confusion . Inability to wake or stay awake . You cough up blood. . Your symptoms become more severe  **This list is not all possible symptoms. Contact your medical provider for any symptoms that are sever or concerning to you.   MAKE SURE YOU   Understand these instructions.  Will watch your condition.  Will get help right  away if you are not doing well or get worse.  Your e-visit answers were reviewed by a board certified advanced clinical practitioner to complete your personal care plan.  Depending on the condition, your plan could have included both over the counter or prescription medications.  If there is a problem please reply once you have received a response from your provider.  Your safety is important to [place].  If you have drug allergies check your prescription carefully.    You can use MyChart to ask questions about today's visit, request a non-urgent call back, or ask for a work or school excuse for 24 hours related to this e-Visit. If it has been greater than 24 hours you will need to follow up with your provider, or enter a new e-Visit to address those concerns. You will get an e-mail in the next two days asking about your experience.  I hope that your e-visit has been valuable and will speed your recovery. Thank you for using e-visits.   Greater than 5 minutes, yet less than 10 minutes of time have been spent researching, coordinating, and implementing care for this patient today.  Thank you for the details you included in the comment boxes. Those details are very helpful in determining the best course of treatment for you and help [place] to provide the best care.

## 2019-07-09 LAB — NOVEL CORONAVIRUS, NAA: SARS-CoV-2, NAA: NOT DETECTED

## 2019-07-28 DIAGNOSIS — Z20828 Contact with and (suspected) exposure to other viral communicable diseases: Secondary | ICD-10-CM | POA: Diagnosis not present

## 2019-08-20 DIAGNOSIS — Z0000 Encounter for general adult medical examination without abnormal findings: Secondary | ICD-10-CM | POA: Diagnosis not present

## 2019-08-20 DIAGNOSIS — Z125 Encounter for screening for malignant neoplasm of prostate: Secondary | ICD-10-CM | POA: Diagnosis not present

## 2019-08-21 DIAGNOSIS — R82998 Other abnormal findings in urine: Secondary | ICD-10-CM | POA: Diagnosis not present

## 2019-08-25 DIAGNOSIS — Z0000 Encounter for general adult medical examination without abnormal findings: Secondary | ICD-10-CM | POA: Diagnosis not present

## 2019-08-25 DIAGNOSIS — Z1331 Encounter for screening for depression: Secondary | ICD-10-CM | POA: Diagnosis not present

## 2019-08-25 DIAGNOSIS — Z13220 Encounter for screening for lipoid disorders: Secondary | ICD-10-CM | POA: Diagnosis not present

## 2019-08-27 DIAGNOSIS — Z1212 Encounter for screening for malignant neoplasm of rectum: Secondary | ICD-10-CM | POA: Diagnosis not present

## 2019-09-15 ENCOUNTER — Encounter: Payer: Self-pay | Admitting: Internal Medicine

## 2019-10-12 ENCOUNTER — Encounter: Payer: Self-pay | Admitting: Internal Medicine

## 2019-11-09 ENCOUNTER — Other Ambulatory Visit: Payer: Self-pay

## 2019-11-09 ENCOUNTER — Encounter: Payer: Self-pay | Admitting: Internal Medicine

## 2019-11-09 ENCOUNTER — Ambulatory Visit (AMBULATORY_SURGERY_CENTER): Payer: BC Managed Care – PPO

## 2019-11-09 VITALS — Temp 96.9°F | Ht 72.0 in | Wt 185.6 lb

## 2019-11-09 DIAGNOSIS — Z1159 Encounter for screening for other viral diseases: Secondary | ICD-10-CM

## 2019-11-09 DIAGNOSIS — Z86010 Personal history of colonic polyps: Secondary | ICD-10-CM

## 2019-11-09 MED ORDER — NA SULFATE-K SULFATE-MG SULF 17.5-3.13-1.6 GM/177ML PO SOLN: 1.0000 | Freq: Once | ORAL | 0 refills | Status: AC

## 2019-11-09 NOTE — Progress Notes (Signed)
No egg or soy allergy known to patient  No issues with past sedation with any surgeries  or procedures, no intubation problems  No diet pills per patient No home 02 use per patient  No blood thinners per patient  Pt denies issues with constipation  No A fib or A flutter  EMMI video sent to pt's e mail   Due to the COVID-19 pandemic we are asking patients to follow these guidelines. Please only bring one care partner. Please be aware that your care partner may wait in the car in the parking lot or if they feel like they will be too hot to wait in the car, they may wait in the lobby on the 4th floor. All care partners are required to wear a mask the entire time (we do not have any that we can provide them), they need to practice social distancing, and we will do a Covid check for all patient's and care partners when you arrive. Also we will check their temperature and your temperature. If the care partner waits in their car they need to stay in the parking lot the entire time and we will call them on their cell phone when the patient is ready for discharge so they can bring the car to the front of the building. Also all patient's will need to wear a mask into building.   suprep coupon given.

## 2019-11-18 ENCOUNTER — Ambulatory Visit (INDEPENDENT_AMBULATORY_CARE_PROVIDER_SITE_OTHER): Payer: BC Managed Care – PPO

## 2019-11-18 ENCOUNTER — Other Ambulatory Visit: Payer: Self-pay | Admitting: Internal Medicine

## 2019-11-18 DIAGNOSIS — Z1159 Encounter for screening for other viral diseases: Secondary | ICD-10-CM | POA: Diagnosis not present

## 2019-11-18 LAB — SARS CORONAVIRUS 2 (TAT 6-24 HRS): SARS Coronavirus 2: NEGATIVE

## 2019-11-23 ENCOUNTER — Ambulatory Visit (AMBULATORY_SURGERY_CENTER): Payer: BC Managed Care – PPO | Admitting: Internal Medicine

## 2019-11-23 ENCOUNTER — Encounter: Payer: Self-pay | Admitting: Internal Medicine

## 2019-11-23 ENCOUNTER — Other Ambulatory Visit: Payer: Self-pay

## 2019-11-23 VITALS — BP 104/68 | HR 43 | Temp 98.0°F | Resp 12 | Ht 72.0 in | Wt 185.6 lb

## 2019-11-23 DIAGNOSIS — K635 Polyp of colon: Secondary | ICD-10-CM | POA: Diagnosis not present

## 2019-11-23 DIAGNOSIS — D123 Benign neoplasm of transverse colon: Secondary | ICD-10-CM

## 2019-11-23 DIAGNOSIS — Z1211 Encounter for screening for malignant neoplasm of colon: Secondary | ICD-10-CM | POA: Diagnosis not present

## 2019-11-23 DIAGNOSIS — Z86010 Personal history of colonic polyps: Secondary | ICD-10-CM | POA: Diagnosis not present

## 2019-11-23 MED ORDER — SODIUM CHLORIDE 0.9 % IV SOLN: 500.0000 mL | Freq: Once | INTRAVENOUS | Status: DC

## 2019-11-23 NOTE — Patient Instructions (Signed)
Please read handouts provided. Continue present medications. Await pathology results.        YOU HAD AN ENDOSCOPIC PROCEDURE TODAY AT THE [facility]:   Refer to the procedure report that was given to you for any specific questions about what was found during the examination.  If the procedure report does not answer your questions, please call your gastroenterologist to clarify.  If you requested that your care partner not be given the details of your procedure findings, then the procedure report has been included in a sealed envelope for you to review at your convenience later.  YOU SHOULD EXPECT: Some feelings of bloating in the abdomen. Passage of more gas than usual.  Walking can help get rid of the air that was put into your GI tract during the procedure and reduce the bloating. If you had a lower endoscopy (such as a colonoscopy or flexible sigmoidoscopy) you may notice spotting of blood in your stool or on the toilet paper. If you underwent a bowel prep for your procedure, you may not have a normal bowel movement for a few days.  Please Note:  You might notice some irritation and congestion in your nose or some drainage.  This is from the oxygen used during your procedure.  There is no need for concern and it should clear up in a day or so.  SYMPTOMS TO REPORT IMMEDIATELY:   Following lower endoscopy (colonoscopy or flexible sigmoidoscopy):  Excessive amounts of blood in the stool  Significant tenderness or worsening of abdominal pains  Swelling of the abdomen that is new, acute  Fever of 100F or higher    For urgent or emergent issues, a gastroenterologist can be reached at any hour by calling [phone].   DIET:  We do recommend a small meal at first, but then you may proceed to your regular diet.  Drink plenty of fluids but you should avoid alcoholic beverages for 24 hours.  ACTIVITY:  You should plan to take it easy for the rest of today and you should NOT  DRIVE or use heavy machinery until tomorrow (because of the sedation medicines used during the test).    FOLLOW UP: Our staff will call the number listed on your records 48-72 hours following your procedure to check on you and address any questions or concerns that you may have regarding the information given to you following your procedure. If we do not reach you, we will leave a message.  We will attempt to reach you two times.  During this call, we will ask if you have developed any symptoms of COVID 19. If you develop any symptoms (ie: fever, flu-like symptoms, shortness of breath, cough etc.) before then, please call [phone].  If you test positive for Covid 19 in the 2 weeks post procedure, please call and report this information to us.    If any biopsies were taken you will be contacted by phone or by letter within the next 1-3 weeks.  Please call us at [phone] if you have not heard about the biopsies in 3 weeks.    SIGNATURES/CONFIDENTIALITY: You and/or your care partner have signed paperwork which will be entered into your electronic medical record.  These signatures attest to the fact that that the information above on your After Visit Summary has been reviewed and is understood.  Full responsibility of the confidentiality of this discharge information lies with you and/or your care-partner. 

## 2019-11-23 NOTE — Progress Notes (Signed)
LC - Temp DT - VS    Pt's states no medical or surgical changes since previsit or office visit.

## 2019-11-23 NOTE — Progress Notes (Signed)
Report to PACU, RN, vss, BBS= Clear.  

## 2019-11-23 NOTE — Op Note (Signed)
[facility] Patient Name: Joshua Smith Procedure Date: 11/23/2019 10:40 AM MRN: [id number] Endoscopist: Jerene Bears , MD Age: 43 Referring MD:  Date of Birth: [birth year] Gender: Male Account #: [id number] Procedure:                Colonoscopy Indications:              High risk colon cancer surveillance: Personal                            history of adenoma (10 mm or greater in size), Last                            colonoscopy: October 2017; family history of colon                            polyps in multiple 1st degree relative and colon                            cancer in multiple 2nd degree relatives Medicines:                Monitored Anesthesia Care Procedure:                Pre-Anesthesia Assessment:                           - Prior to the procedure, a History and Physical                            was performed, and patient medications and                            allergies were reviewed. The patient's tolerance of                            previous anesthesia was also reviewed. The risks                            and benefits of the procedure and the sedation                            options and risks were discussed with the patient.                            All questions were answered, and informed consent                            was obtained. Prior Anticoagulants: The patient has                            taken no previous anticoagulant or antiplatelet                            agents. ASA Grade Assessment: II - A patient with  mild systemic disease. After reviewing the risks                            and benefits, the patient was deemed in                            satisfactory condition to undergo the procedure.                           After obtaining informed consent, the colonoscope                            was passed under direct vision. Throughout the                            procedure, the patient's  blood pressure, pulse, and                            oxygen saturations were monitored continuously. The                            Colonoscope was introduced through the anus and                            advanced to the terminal ileum. The colonoscopy was                            performed without difficulty. The patient tolerated                            the procedure well. The quality of the bowel                            preparation was good. The terminal ileum, ileocecal                            valve, appendiceal orifice, and rectum were                            photographed. Scope In: 10:52:16 AM Scope Out: 11:07:31 AM Scope Withdrawal Time: 0 hours 12 minutes 59 seconds  Total Procedure Duration: 0 hours 15 minutes 15 seconds  Findings:                 The digital rectal exam was normal.                           The terminal ileum appeared normal.                           A 4 mm polyp was found in the transverse colon. The                            polyp was sessile. The polyp was removed with a  cold snare. Resection and retrieval were complete.                           The exam was otherwise normal throughout the                            examined colon.                           External hemorrhoids were found during                            retroflexion. The hemorrhoids were small. Complications:            No immediate complications. Estimated Blood Loss:     Estimated blood loss: none. Impression:               - The examined portion of the ileum was normal.                           - One 4 mm polyp in the transverse colon, removed                            with a cold snare. Resected and retrieved.                           - Otherwise normal colon.                           - Small hemorrhoids. Recommendation:           - Patient has a contact number available for                            emergencies. The signs and  symptoms of potential                            delayed complications were discussed with the                            patient. Return to normal activities tomorrow.                            Written discharge instructions were provided to the                            patient.                           - Resume previous diet.                           - Continue present medications.                           - Await pathology results.                           -  Repeat colonoscopy in 5 years for surveillance. Jerene Bears, MD 11/23/2019 11:11:14 AM This report has been signed electronically.

## 2019-11-23 NOTE — Progress Notes (Signed)
Called to room to assist during endoscopic procedure.  Patient ID and intended procedure confirmed with present staff. Received instructions for my participation in the procedure from the performing physician.  

## 2019-11-25 ENCOUNTER — Encounter: Payer: Self-pay | Admitting: Internal Medicine

## 2019-11-25 ENCOUNTER — Telehealth: Payer: Self-pay | Admitting: *Deleted

## 2019-11-25 NOTE — Telephone Encounter (Signed)
Follow up call made, left message. 

## 2019-11-25 NOTE — Telephone Encounter (Signed)
Follow call made. Left message.

## 2020-05-02 IMAGING — CT CT HEART SCORING
2 series · 16 of 20 positions shown, 18 images · non-contrast
Comparison: 07/08/2015

CLINICAL DATA: Risk stratification

EXAM:
Coronary Calcium Score
TECHNIQUE: The patient was scanned on a Siemens Force scanner. Axial
non-contrast 3 mm slices were carried out through the heart. The
data set was analyzed on a dedicated work station and scored using
the Agatson method.

[Series 2: casc 3.0 i36f 2 bestdiast 73 % · axial · 0.39mm/px · z∈[-281,-149]mm · 8 of 58 slices shown, 10 images]
[im 7/58  vessel]
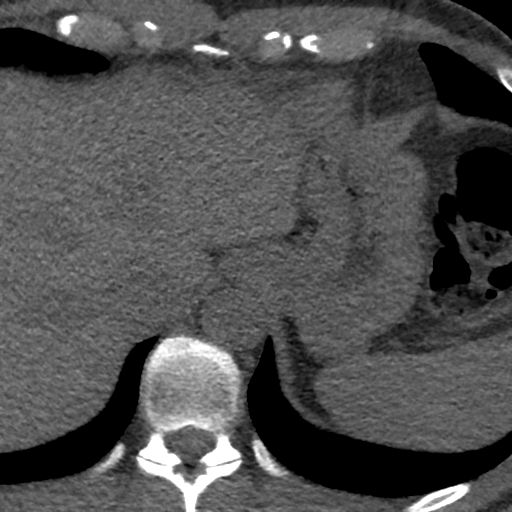
[im 7/58  lung]
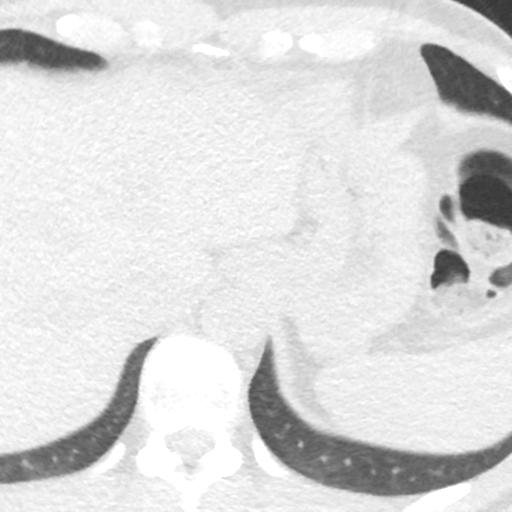
[im 13/58  vessel]
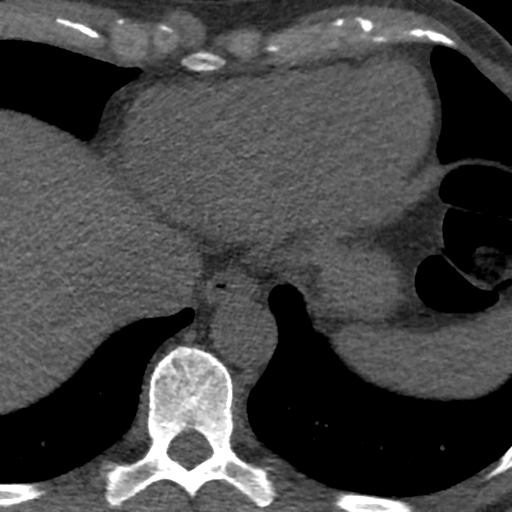
[im 20/58  vessel]
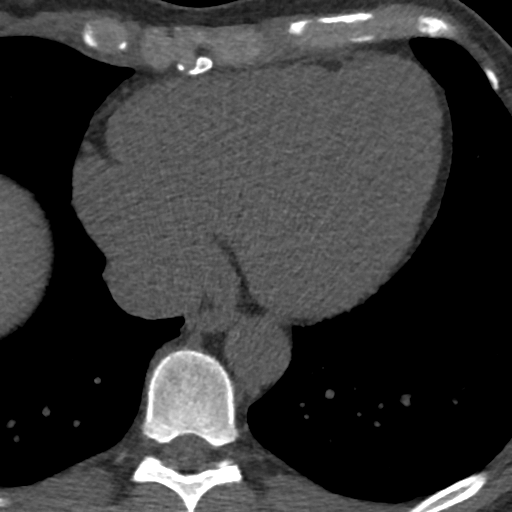
[im 26/58  vessel]
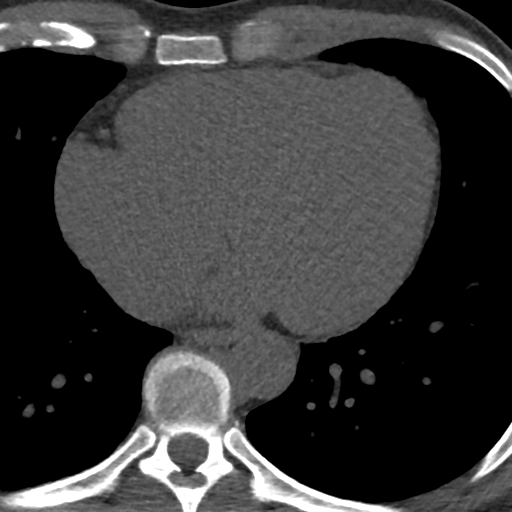
[im 32/58  vessel]
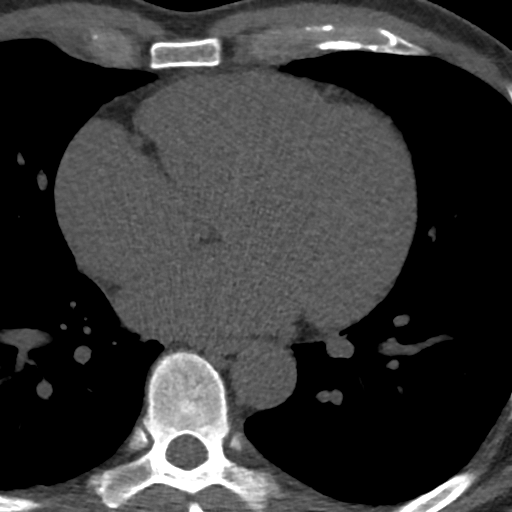
[im 32/58  lung]
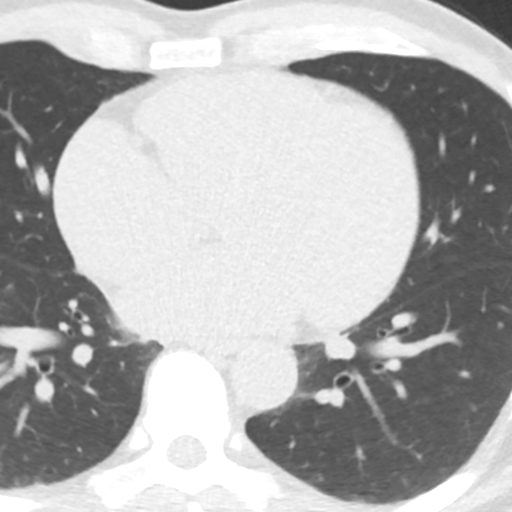
[im 39/58  vessel]
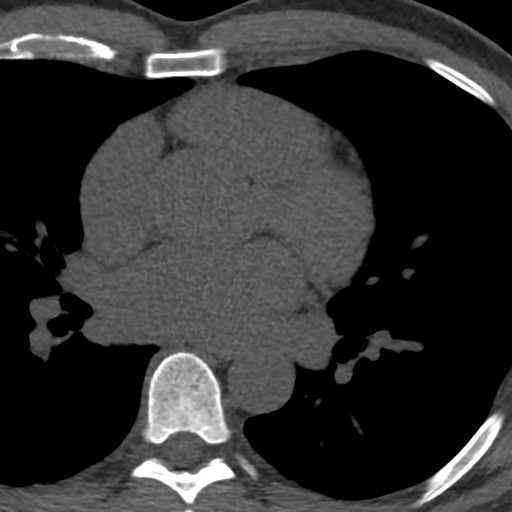
[im 45/58  vessel]
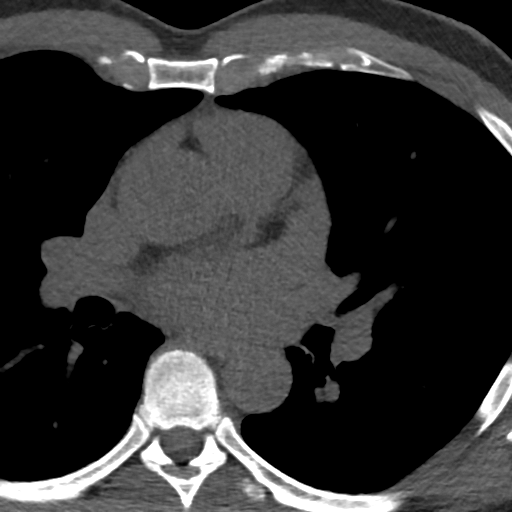
[im 51/58  vessel]
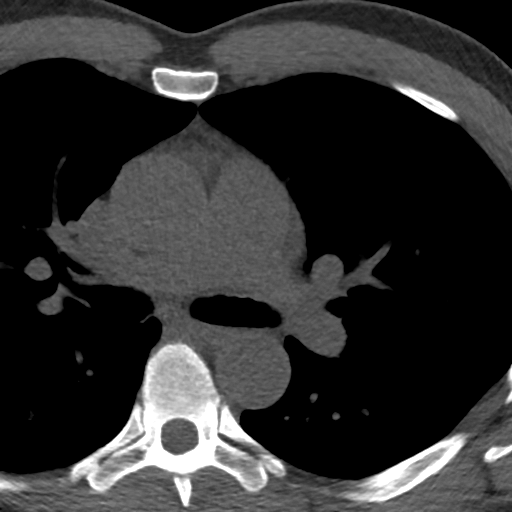

[Series 4: lung st 73 % · axial · 0.68mm/px · z∈[-280,-148]mm · 8 of 58 slices shown]
[im 7/58  lung]
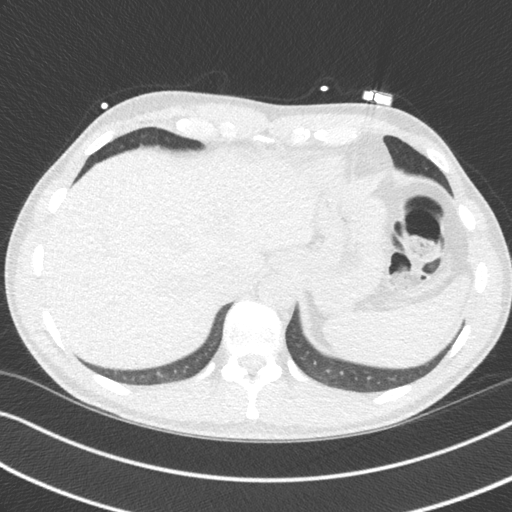
[im 13/58  lung]
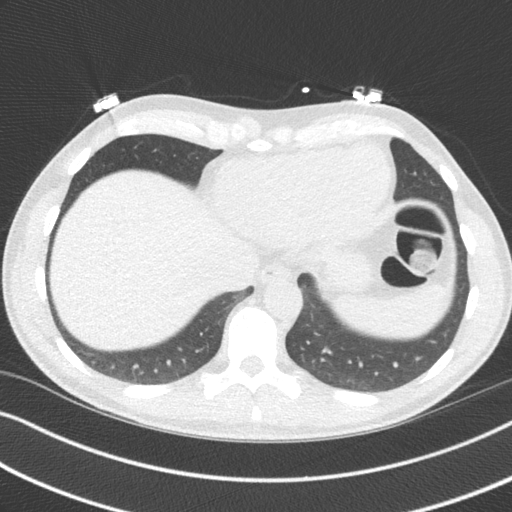
[im 20/58  lung]
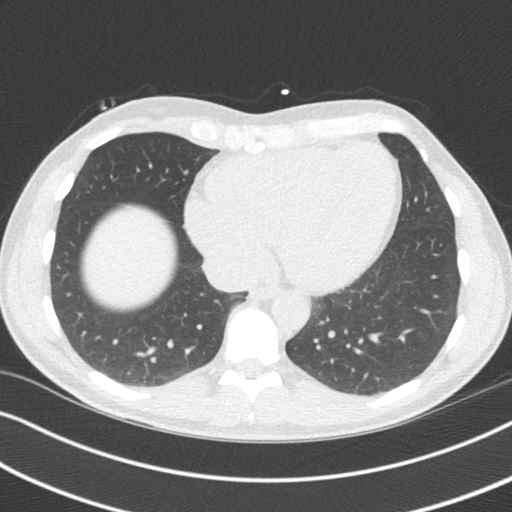
[im 26/58  lung]
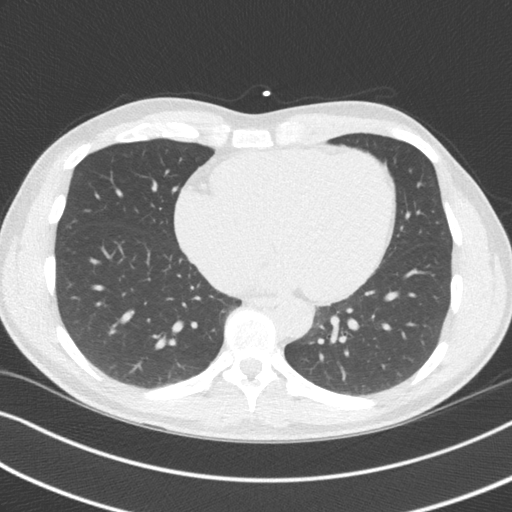
[im 32/58  lung]
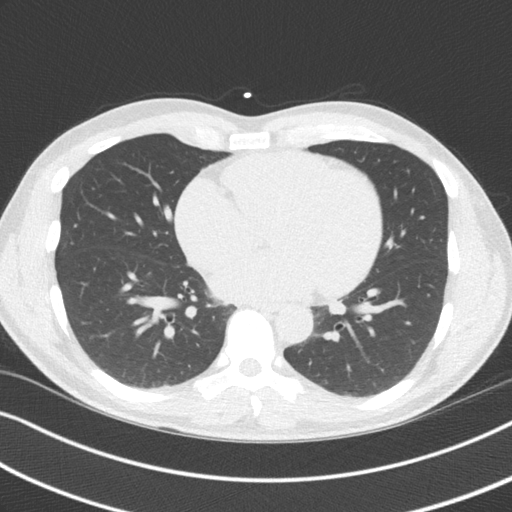
[im 39/58  lung]
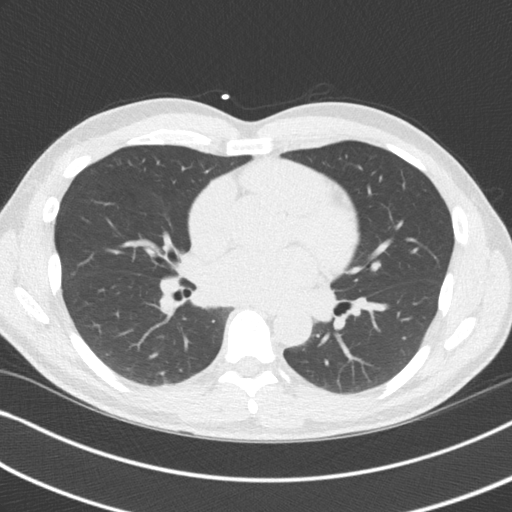
[im 45/58  lung]
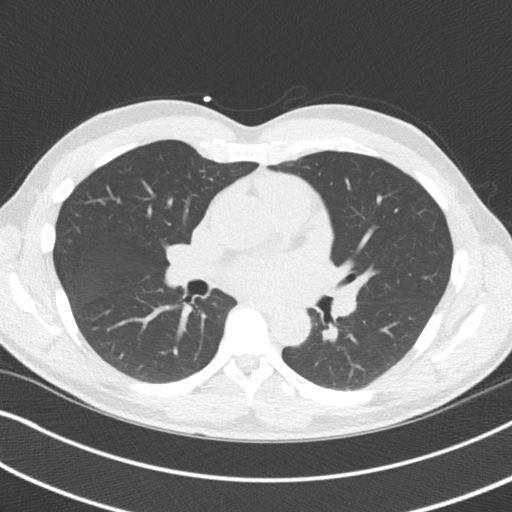
[im 51/58  lung]
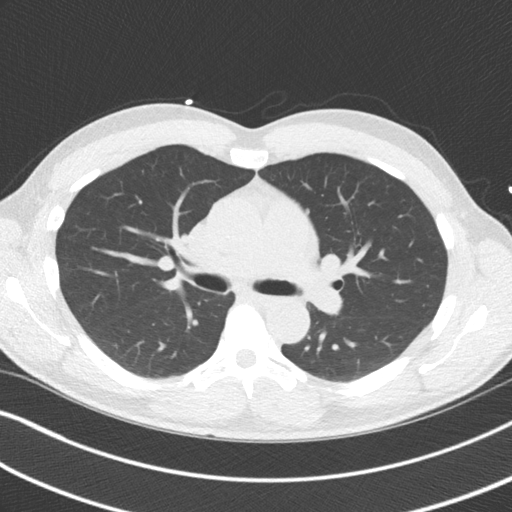

[16 of 20 positions shown; findings below may reference images not displayed]

FINDINGS: Non-cardiac: See separate report from [REDACTED].

Ascending Aorta: Normal size, no calcifications.

Pericardium: Normal.

Coronary arteries: Normal arteries.
IMPRESSION: Coronary calcium score of 0. This was 0 percentile for age and sex
matched control.

Nida Kitts

EXAM:
OVER-READ INTERPRETATION  CT CHEST

The following report is an over-read performed by radiologist Dr.
Paulus N Ceejay [REDACTED] on 10/09/2018. This
over-read does not include interpretation of cardiac or coronary
anatomy or pathology. The coronary calcium score interpretation by
the cardiologist is attached.
FINDINGS: Vascular: Heart is normal size.  Visualized aorta is normal caliber.

Mediastinum/Nodes: No adenopathy in the lower mediastinum or hila.

Lungs/Pleura: Visualized lungs clear.

Upper Abdomen: Imaging into the upper abdomen shows no acute
findings.

Musculoskeletal: Chest wall soft tissues are unremarkable. No acute
bony abnormality.
IMPRESSION: No acute or significant extracardiac abnormality.

## 2020-07-08 DIAGNOSIS — M25511 Pain in right shoulder: Secondary | ICD-10-CM | POA: Diagnosis not present

## 2020-08-17 DIAGNOSIS — Z125 Encounter for screening for malignant neoplasm of prostate: Secondary | ICD-10-CM | POA: Diagnosis not present

## 2020-08-17 DIAGNOSIS — Z0000 Encounter for general adult medical examination without abnormal findings: Secondary | ICD-10-CM | POA: Diagnosis not present

## 2020-08-17 DIAGNOSIS — Z13220 Encounter for screening for lipoid disorders: Secondary | ICD-10-CM | POA: Diagnosis not present

## 2020-08-24 DIAGNOSIS — Z1331 Encounter for screening for depression: Secondary | ICD-10-CM | POA: Diagnosis not present

## 2020-08-24 DIAGNOSIS — Z0000 Encounter for general adult medical examination without abnormal findings: Secondary | ICD-10-CM | POA: Diagnosis not present

## 2020-09-13 DIAGNOSIS — Z1212 Encounter for screening for malignant neoplasm of rectum: Secondary | ICD-10-CM | POA: Diagnosis not present

## 2021-02-06 ENCOUNTER — Other Ambulatory Visit: Payer: Self-pay | Admitting: Student

## 2021-09-05 DIAGNOSIS — Z125 Encounter for screening for malignant neoplasm of prostate: Secondary | ICD-10-CM | POA: Diagnosis not present

## 2021-09-05 DIAGNOSIS — Z13220 Encounter for screening for lipoid disorders: Secondary | ICD-10-CM | POA: Diagnosis not present

## 2021-09-05 DIAGNOSIS — Z0000 Encounter for general adult medical examination without abnormal findings: Secondary | ICD-10-CM | POA: Diagnosis not present

## 2021-09-12 DIAGNOSIS — R82998 Other abnormal findings in urine: Secondary | ICD-10-CM | POA: Diagnosis not present

## 2021-09-12 DIAGNOSIS — Z1331 Encounter for screening for depression: Secondary | ICD-10-CM | POA: Diagnosis not present

## 2021-09-12 DIAGNOSIS — Z23 Encounter for immunization: Secondary | ICD-10-CM | POA: Diagnosis not present

## 2021-09-12 DIAGNOSIS — Z0000 Encounter for general adult medical examination without abnormal findings: Secondary | ICD-10-CM | POA: Diagnosis not present

## 2021-09-12 DIAGNOSIS — Z1339 Encounter for screening examination for other mental health and behavioral disorders: Secondary | ICD-10-CM | POA: Diagnosis not present

## 2021-10-11 DIAGNOSIS — L720 Epidermal cyst: Secondary | ICD-10-CM | POA: Diagnosis not present

## 2021-10-11 DIAGNOSIS — L02212 Cutaneous abscess of back [any part, except buttock]: Secondary | ICD-10-CM | POA: Diagnosis not present

## 2021-12-25 DIAGNOSIS — L820 Inflamed seborrheic keratosis: Secondary | ICD-10-CM | POA: Diagnosis not present

## 2021-12-25 DIAGNOSIS — D225 Melanocytic nevi of trunk: Secondary | ICD-10-CM | POA: Diagnosis not present

## 2021-12-25 DIAGNOSIS — L821 Other seborrheic keratosis: Secondary | ICD-10-CM | POA: Diagnosis not present

## 2021-12-25 DIAGNOSIS — D1801 Hemangioma of skin and subcutaneous tissue: Secondary | ICD-10-CM | POA: Diagnosis not present

## 2022-09-26 DIAGNOSIS — Z0000 Encounter for general adult medical examination without abnormal findings: Secondary | ICD-10-CM | POA: Diagnosis not present

## 2022-09-26 DIAGNOSIS — R7989 Other specified abnormal findings of blood chemistry: Secondary | ICD-10-CM | POA: Diagnosis not present

## 2022-09-26 DIAGNOSIS — Z125 Encounter for screening for malignant neoplasm of prostate: Secondary | ICD-10-CM | POA: Diagnosis not present

## 2022-10-03 DIAGNOSIS — Z1331 Encounter for screening for depression: Secondary | ICD-10-CM | POA: Diagnosis not present

## 2022-10-03 DIAGNOSIS — Z1339 Encounter for screening examination for other mental health and behavioral disorders: Secondary | ICD-10-CM | POA: Diagnosis not present

## 2022-10-03 DIAGNOSIS — M5441 Lumbago with sciatica, right side: Secondary | ICD-10-CM | POA: Diagnosis not present

## 2022-10-03 DIAGNOSIS — R82998 Other abnormal findings in urine: Secondary | ICD-10-CM | POA: Diagnosis not present

## 2022-10-03 DIAGNOSIS — Z0000 Encounter for general adult medical examination without abnormal findings: Secondary | ICD-10-CM | POA: Diagnosis not present

## 2023-08-08 DIAGNOSIS — M25531 Pain in right wrist: Secondary | ICD-10-CM | POA: Diagnosis not present

## 2023-08-08 DIAGNOSIS — M79641 Pain in right hand: Secondary | ICD-10-CM | POA: Diagnosis not present

## 2023-08-25 DIAGNOSIS — M25531 Pain in right wrist: Secondary | ICD-10-CM | POA: Diagnosis not present

## 2023-08-26 DIAGNOSIS — M5451 Vertebrogenic low back pain: Secondary | ICD-10-CM | POA: Diagnosis not present

## 2023-08-26 DIAGNOSIS — M5459 Other low back pain: Secondary | ICD-10-CM | POA: Diagnosis not present

## 2023-09-12 DIAGNOSIS — M5451 Vertebrogenic low back pain: Secondary | ICD-10-CM | POA: Diagnosis not present

## 2023-09-20 DIAGNOSIS — M5451 Vertebrogenic low back pain: Secondary | ICD-10-CM | POA: Diagnosis not present

## 2023-10-15 DIAGNOSIS — Z125 Encounter for screening for malignant neoplasm of prostate: Secondary | ICD-10-CM | POA: Diagnosis not present

## 2023-10-15 DIAGNOSIS — N281 Cyst of kidney, acquired: Secondary | ICD-10-CM | POA: Diagnosis not present

## 2023-10-15 DIAGNOSIS — Z1389 Encounter for screening for other disorder: Secondary | ICD-10-CM | POA: Diagnosis not present

## 2023-10-15 DIAGNOSIS — N200 Calculus of kidney: Secondary | ICD-10-CM | POA: Diagnosis not present

## 2023-10-24 DIAGNOSIS — Z0000 Encounter for general adult medical examination without abnormal findings: Secondary | ICD-10-CM | POA: Diagnosis not present

## 2023-10-24 DIAGNOSIS — M5441 Lumbago with sciatica, right side: Secondary | ICD-10-CM | POA: Diagnosis not present

## 2023-10-24 DIAGNOSIS — Z1331 Encounter for screening for depression: Secondary | ICD-10-CM | POA: Diagnosis not present

## 2023-10-24 DIAGNOSIS — R82998 Other abnormal findings in urine: Secondary | ICD-10-CM | POA: Diagnosis not present

## 2023-10-24 DIAGNOSIS — Z1339 Encounter for screening examination for other mental health and behavioral disorders: Secondary | ICD-10-CM | POA: Diagnosis not present

## 2023-10-31 DIAGNOSIS — N281 Cyst of kidney, acquired: Secondary | ICD-10-CM | POA: Diagnosis not present

## 2024-01-20 ENCOUNTER — Other Ambulatory Visit: Payer: Self-pay | Admitting: Urology

## 2024-02-06 DIAGNOSIS — R8271 Bacteriuria: Secondary | ICD-10-CM | POA: Diagnosis not present

## 2024-02-12 ENCOUNTER — Encounter (HOSPITAL_COMMUNITY): Payer: Self-pay

## 2024-02-12 NOTE — Patient Instructions (Signed)
 SURGICAL WAITING ROOM VISITATION  Patients having surgery or a procedure may have no more than 2 support people in the waiting area - these visitors may rotate.    Children under the age of 52 must have an adult with them who is not the patient.  Due to an increase in RSV and influenza rates and associated hospitalizations, children ages 35 and under may not visit patients in [facility] hospitals.  Visitors with respiratory illnesses are discouraged from visiting and should remain at home.  If the patient needs to stay at the hospital during part of their recovery, the visitor guidelines for inpatient rooms apply. Pre-op nurse will coordinate an appropriate time for 1 support person to accompany patient in pre-op.  This support person may not rotate.    Please refer to the [facility] website for the visitor guidelines for Inpatients (after your surgery is over and you are in a regular room).       Your procedure is scheduled on: 02-18-24   Report to [facility] Main Entrance    Report to admitting at 0515  AM   Call this number if you have problems the morning of surgery [phone]   Do not eat food  or drink liquids :After Midnight.                If you have questions, please contact your surgeon's office.   FOLLOW ANY ADDITIONAL PRE OP INSTRUCTIONS YOU RECEIVED FROM YOUR SURGEON'S OFFICE!!!     Oral Hygiene is also important to reduce your risk of infection.                                    Remember - BRUSH YOUR TEETH THE MORNING OF SURGERY WITH YOUR REGULAR TOOTHPASTE  DENTURES WILL BE REMOVED PRIOR TO SURGERY PLEASE DO NOT APPLY "Poly grip" OR ADHESIVES!!!   Do NOT smoke after Midnight   Stop all vitamins and herbal supplements 7 days before surgery.   Take these medicines the morning of surgery with A SIP OF WATER: tylenol if needed                                 You may not have any metal on your body including hair pins, jewelry, and body  piercing             Do not wear lotions, powders, perfumes/cologne, or deodorant               Men may shave face and neck.   Do not bring valuables to the hospital. [place] IS NOT             RESPONSIBLE   FOR VALUABLES.   Contacts, glasses, dentures or bridgework may not be worn into surgery.   Bring small overnight bag day of surgery.   DO NOT BRING YOUR HOME MEDICATIONS TO THE HOSPITAL. PHARMACY WILL DISPENSE MEDICATIONS LISTED ON YOUR MEDICATION LIST TO YOU DURING YOUR ADMISSION IN THE HOSPITAL!    Patients discharged on the day of surgery will not be allowed to drive home.  Someone NEEDS to stay with you for the first 24 hours after anesthesia.   Special Instructions: Bring a copy of your healthcare power of attorney and living will documents the day of surgery if you haven't scanned them before.  Please read over the following fact sheets you were given: IF YOU HAVE QUESTIONS ABOUT YOUR PRE-OP INSTRUCTIONS PLEASE CALL [phone]    If you test positive for Covid or have been in contact with anyone that has tested positive in the last 10 days please notify you surgeon.    [place] - Preparing for Surgery Before surgery, you can play an important role.  Because skin is not sterile, your skin needs to be as free of germs as possible.  You can reduce the number of germs on your skin by washing with CHG (chlorahexidine gluconate) soap before surgery.  CHG is an antiseptic cleaner which kills germs and bonds with the skin to continue killing germs even after washing. Please DO NOT use if you have an allergy to CHG or antibacterial soaps.  If your skin becomes reddened/irritated stop using the CHG and inform your nurse when you arrive at Short Stay. Do not shave (including legs and underarms) for at least 48 hours prior to the first CHG shower.  You may shave your face/neck. Please follow these instructions carefully:  1.  Shower with CHG Soap the night before  surgery and the  morning of Surgery.  2.  If you choose to wash your hair, wash your hair first as usual with your  normal  shampoo.  3.  After you shampoo, rinse your hair and body thoroughly to remove the  shampoo.                           4.  Use CHG as you would any other liquid soap.  You can apply chg directly  to the skin and wash                       Gently with a scrungie or clean washcloth.  5.  Apply the CHG Soap to your body ONLY FROM THE NECK DOWN.   Do not use on face/ open                           Wound or open sores. Avoid contact with eyes, ears mouth and genitals (private parts).                       Wash face,  Genitals (private parts) with your normal soap.             6.  Wash thoroughly, paying special attention to the area where your surgery  will be performed.  7.  Thoroughly rinse your body with warm water from the neck down.  8.  DO NOT shower/wash with your normal soap after using and rinsing off  the CHG Soap.                9.  Pat yourself dry with a clean towel.            10.  Wear clean pajamas.            11.  Place clean sheets on your bed the night of your first shower and do not  sleep with pets. Day of Surgery : Do not apply any lotions/deodorants the morning of surgery.  Please wear clean clothes to the hospital/surgery center.  FAILURE TO FOLLOW THESE INSTRUCTIONS MAY RESULT IN THE CANCELLATION OF YOUR SURGERY PATIENT SIGNATURE_________________________________  NURSE SIGNATURE__________________________________  ________________________________________________________________________

## 2024-02-12 NOTE — Progress Notes (Signed)
 PCP - Eric Form , MD Cardiologist - no  Saw for workup in 2019 no issues  PPM/ICD -  Device Orders -  Rep Notified -   Chest x-ray -  EKG - 2019 EPIC Stress Test - 2019 EPIC ECHO -  Cardiac Cath -  Ct CARDIAC SCORE- 2019 EPIC Sleep Study -  CPAP -   Fasting Blood Sugar -  Checks Blood Sugar _____ times a day  Blood Thinner Instructions: Aspirin Instructions:  ERAS Protcol -n/a PRE-SURGERY   COVID vaccine -yes  Activity--Able to climb a flight of stair with no CP or SOB. Does cardio 5 times a week Anesthesia review:   Patient denies shortness of breath, fever, cough and chest pain at PAT appointment   All instructions explained to the patient, with a verbal understanding of the material. Patient agrees to go over the instructions while at home for a better understanding. Patient also instructed to self quarantine after being tested for COVID-19. The opportunity to ask questions was provided.

## 2024-02-13 ENCOUNTER — Encounter (HOSPITAL_COMMUNITY): Payer: Self-pay

## 2024-02-13 ENCOUNTER — Other Ambulatory Visit: Payer: Self-pay

## 2024-02-13 ENCOUNTER — Encounter (HOSPITAL_COMMUNITY)
Admission: RE | Admit: 2024-02-13 | Discharge: 2024-02-13 | Disposition: A | Discharge status: Home / Self Care | Payer: BC Managed Care – PPO | Source: Ambulatory Visit | Attending: Urology | Admitting: Urology

## 2024-02-13 VITALS — BP 122/78 | HR 47 | Temp 97.9°F | Resp 16 | Ht 71.0 in | Wt 194.0 lb

## 2024-02-13 DIAGNOSIS — Z01812 Encounter for preprocedural laboratory examination: Secondary | ICD-10-CM | POA: Diagnosis not present

## 2024-02-13 DIAGNOSIS — Z01818 Encounter for other preprocedural examination: Secondary | ICD-10-CM | POA: Diagnosis not present

## 2024-02-13 HISTORY — DX: Personal history of urinary calculi: Z87.442

## 2024-02-13 LAB — CBC
HCT: 39.7 % (ref 39.0–52.0)
Hemoglobin: 14.1 g/dL (ref 13.0–17.0)
MCH: 32.8 pg (ref 26.0–34.0)
MCHC: 35.5 g/dL (ref 30.0–36.0)
MCV: 92.3 fL (ref 80.0–100.0)
Platelets: 206 10*3/uL (ref 150–400)
RBC: 4.3 MIL/uL (ref 4.22–5.81)
RDW: 11.8 % (ref 11.5–15.5)
WBC: 3.8 10*3/uL — ABNORMAL LOW (ref 4.0–10.5)
nRBC: 0 % (ref 0.0–0.2)

## 2024-02-17 NOTE — Anesthesia Preprocedure Evaluation (Signed)
 Anesthesia Evaluation  Patient identified by MRN, date of birth, ID band Patient awake    Reviewed: Allergy & Precautions, H&P , NPO status , Patient's Chart, lab work & pertinent test results  Airway Mallampati: I  TM Distance: >3 FB Neck ROM: Full    Dental no notable dental hx. (+) Teeth Intact, Dental Advisory Given   Pulmonary neg pulmonary ROS   Pulmonary exam normal breath sounds clear to auscultation       Cardiovascular Exercise Tolerance: Good negative cardio ROS Normal cardiovascular exam Rhythm:Regular Rate:Normal     Neuro/Psych   Anxiety     negative neurological ROS  negative psych ROS   GI/Hepatic negative GI ROS, Neg liver ROS,,,  Endo/Other  negative endocrine ROS    Renal/GU negative Renal ROS  negative genitourinary   Musculoskeletal negative musculoskeletal ROS (+)    Abdominal   Peds negative pediatric ROS (+)  Hematology negative hematology ROS (+)   Anesthesia Other Findings   Reproductive/Obstetrics negative OB ROS                             Anesthesia Physical Anesthesia Plan  ASA: 2  Anesthesia Plan: General   Post-op Pain Management: Tylenol PO (pre-op)*, Celebrex PO (pre-op)* and Minimal or no pain anticipated   Induction: Intravenous  PONV Risk Score and Plan: 2 and Ondansetron, Dexamethasone and Treatment may vary due to age or medical condition  Airway Management Planned: Oral ETT and LMA  Additional Equipment: None  Intra-op Plan:   Post-operative Plan: Extubation in OR  Informed Consent: I have reviewed the patients History and Physical, chart, labs and discussed the procedure including the risks, benefits and alternatives for the proposed anesthesia with the patient or authorized representative who has indicated his/her understanding and acceptance.       Plan Discussed with: Anesthesiologist and CRNA  Anesthesia Plan Comments:  (  )       Anesthesia Quick Evaluation

## 2024-02-18 ENCOUNTER — Ambulatory Visit (HOSPITAL_COMMUNITY): Payer: Self-pay | Admitting: Anesthesiology

## 2024-02-18 ENCOUNTER — Ambulatory Visit (HOSPITAL_COMMUNITY)
Admission: RE | Admit: 2024-02-18 | Discharge: 2024-02-18 | Disposition: A | Discharge status: Home / Self Care | Payer: BC Managed Care – PPO | Attending: Urology | Admitting: Urology

## 2024-02-18 ENCOUNTER — Other Ambulatory Visit: Payer: Self-pay

## 2024-02-18 ENCOUNTER — Ambulatory Visit (HOSPITAL_COMMUNITY)

## 2024-02-18 ENCOUNTER — Encounter (HOSPITAL_COMMUNITY): Payer: Self-pay | Admitting: Urology

## 2024-02-18 ENCOUNTER — Encounter (HOSPITAL_COMMUNITY)
Admission: RE | Disposition: A | Discharge status: Home / Self Care | Payer: Self-pay | Source: Home / Self Care | Attending: Urology

## 2024-02-18 DIAGNOSIS — N200 Calculus of kidney: Secondary | ICD-10-CM | POA: Insufficient documentation

## 2024-02-18 DIAGNOSIS — N135 Crossing vessel and stricture of ureter without hydronephrosis: Secondary | ICD-10-CM | POA: Insufficient documentation

## 2024-02-18 HISTORY — PX: CYSTOSCOPY/URETEROSCOPY/HOLMIUM LASER/STENT PLACEMENT: SHX6546

## 2024-02-18 SURGERY — CYSTOSCOPY/URETEROSCOPY/HOLMIUM LASER/STENT PLACEMENT
Anesthesia: General | Site: Pelvis | Laterality: Bilateral

## 2024-02-18 MED ORDER — FENTANYL CITRATE PF 50 MCG/ML IJ SOSY: 25.0000 ug | PREFILLED_SYRINGE | INTRAMUSCULAR | Status: DC | PRN

## 2024-02-18 MED ORDER — ONDANSETRON HCL 4 MG/2ML IJ SOLN
INTRAMUSCULAR | Status: DC | PRN
  Administered 2024-02-18: 4 mg via INTRAVENOUS

## 2024-02-18 MED ORDER — PROPOFOL 10 MG/ML IV BOLUS
INTRAVENOUS | Status: AC
  Filled 2024-02-18: qty 20

## 2024-02-18 MED ORDER — OXYCODONE HCL 5 MG PO TABS: 5.0000 mg | ORAL_TABLET | Freq: Once | ORAL | Status: DC | PRN

## 2024-02-18 MED ORDER — ONDANSETRON HCL 4 MG/2ML IJ SOLN: 4.0000 mg | Freq: Once | INTRAMUSCULAR | Status: DC | PRN

## 2024-02-18 MED ORDER — MIDAZOLAM HCL 2 MG/2ML IJ SOLN
INTRAMUSCULAR | Status: AC
  Filled 2024-02-18: qty 2

## 2024-02-18 MED ORDER — CIPROFLOXACIN IN D5W 400 MG/200ML IV SOLN
400.0000 mg | Freq: Two times a day (BID) | INTRAVENOUS | Status: AC
  Administered 2024-02-18: 400 mg via INTRAVENOUS
  Filled 2024-02-18: qty 200

## 2024-02-18 MED ORDER — CEFAZOLIN SODIUM-DEXTROSE 2-4 GM/100ML-% IV SOLN: 2.0000 g | INTRAVENOUS | Status: DC

## 2024-02-18 MED ORDER — FENTANYL CITRATE (PF) 100 MCG/2ML IJ SOLN
INTRAMUSCULAR | Status: AC
Start: 2024-02-18 — End: ?
  Filled 2024-02-18: qty 2

## 2024-02-18 MED ORDER — CHLORHEXIDINE GLUCONATE 0.12 % MT SOLN
15.0000 mL | Freq: Once | OROMUCOSAL | Status: AC
  Administered 2024-02-18: 15 mL via OROMUCOSAL

## 2024-02-18 MED ORDER — ONDANSETRON HCL 4 MG/2ML IJ SOLN
INTRAMUSCULAR | Status: AC
  Filled 2024-02-18: qty 2

## 2024-02-18 MED ORDER — DEXAMETHASONE SODIUM PHOSPHATE 10 MG/ML IJ SOLN
INTRAMUSCULAR | Status: AC
  Filled 2024-02-18: qty 1

## 2024-02-18 MED ORDER — FENTANYL CITRATE (PF) 100 MCG/2ML IJ SOLN
INTRAMUSCULAR | Status: AC
  Filled 2024-02-18: qty 2

## 2024-02-18 MED ORDER — LIDOCAINE HCL (PF) 2 % IJ SOLN
INTRAMUSCULAR | Status: AC
  Filled 2024-02-18: qty 5

## 2024-02-18 MED ORDER — ACETAMINOPHEN 160 MG/5ML PO SOLN: 325.0000 mg | ORAL | Status: DC | PRN

## 2024-02-18 MED ORDER — LACTATED RINGERS IV SOLN: INTRAVENOUS | Status: DC

## 2024-02-18 MED ORDER — CELECOXIB 200 MG PO CAPS
200.0000 mg | ORAL_CAPSULE | Freq: Once | ORAL | Status: AC
  Administered 2024-02-18: 200 mg via ORAL
  Filled 2024-02-18: qty 1

## 2024-02-18 MED ORDER — DEXAMETHASONE SODIUM PHOSPHATE 10 MG/ML IJ SOLN
INTRAMUSCULAR | Status: DC | PRN
  Administered 2024-02-18: 5 mg via INTRAVENOUS

## 2024-02-18 MED ORDER — KETOROLAC TROMETHAMINE 30 MG/ML IJ SOLN
INTRAMUSCULAR | Status: AC
  Filled 2024-02-18: qty 1

## 2024-02-18 MED ORDER — GLYCOPYRROLATE 0.2 MG/ML IJ SOLN
INTRAMUSCULAR | Status: AC
  Filled 2024-02-18: qty 1

## 2024-02-18 MED ORDER — MIDAZOLAM HCL 5 MG/5ML IJ SOLN
INTRAMUSCULAR | Status: DC | PRN
  Administered 2024-02-18: 2 mg via INTRAVENOUS

## 2024-02-18 MED ORDER — ONDANSETRON HCL 4 MG PO TABS: 4.0000 mg | ORAL_TABLET | Freq: Every day | ORAL | 1 refills | Status: AC | PRN

## 2024-02-18 MED ORDER — ACETAMINOPHEN 500 MG PO TABS
1000.0000 mg | ORAL_TABLET | Freq: Once | ORAL | Status: AC
  Administered 2024-02-18: 1000 mg via ORAL
  Filled 2024-02-18: qty 2

## 2024-02-18 MED ORDER — CIPROFLOXACIN IN D5W 400 MG/200ML IV SOLN: 400.0000 mg | Freq: Two times a day (BID) | INTRAVENOUS | Status: DC

## 2024-02-18 MED ORDER — MEPERIDINE HCL 50 MG/ML IJ SOLN: 6.2500 mg | INTRAMUSCULAR | Status: DC | PRN

## 2024-02-18 MED ORDER — OXYCODONE-ACETAMINOPHEN 5-325 MG PO TABS
1.0000 | ORAL_TABLET | ORAL | 0 refills | Status: AC | PRN
Start: 2024-02-18 — End: ?

## 2024-02-18 MED ORDER — FENTANYL CITRATE (PF) 100 MCG/2ML IJ SOLN
INTRAMUSCULAR | Status: DC | PRN
  Administered 2024-02-18 (×2): 25 ug via INTRAVENOUS
  Administered 2024-02-18: 50 ug via INTRAVENOUS
  Administered 2024-02-18 (×2): 25 ug via INTRAVENOUS

## 2024-02-18 MED ORDER — EPHEDRINE SULFATE-NACL 50-0.9 MG/10ML-% IV SOSY
PREFILLED_SYRINGE | INTRAVENOUS | Status: DC | PRN
  Administered 2024-02-18: 5 mg via INTRAVENOUS

## 2024-02-18 MED ORDER — ACETAMINOPHEN 325 MG PO TABS: 325.0000 mg | ORAL_TABLET | ORAL | Status: DC | PRN

## 2024-02-18 MED ORDER — LIDOCAINE HCL (PF) 2 % IJ SOLN
INTRAMUSCULAR | Status: DC | PRN
  Administered 2024-02-18: 100 mg via INTRADERMAL

## 2024-02-18 MED ORDER — SODIUM CHLORIDE 0.9 % IR SOLN
Status: DC | PRN
  Administered 2024-02-18: 3000 mL
  Administered 2024-02-18: 1000 mL

## 2024-02-18 MED ORDER — EPHEDRINE 5 MG/ML INJ
INTRAVENOUS | Status: AC
  Filled 2024-02-18: qty 5

## 2024-02-18 MED ORDER — KETOROLAC TROMETHAMINE 30 MG/ML IJ SOLN
INTRAMUSCULAR | Status: DC | PRN
Start: 2024-02-18 — End: 2024-02-18
  Administered 2024-02-18: 15 mg via INTRAVENOUS

## 2024-02-18 MED ORDER — IOHEXOL 300 MG/ML  SOLN
INTRAMUSCULAR | Status: DC | PRN
  Administered 2024-02-18: 12 mL

## 2024-02-18 MED ORDER — CIPROFLOXACIN HCL 500 MG PO TABS: 500.0000 mg | ORAL_TABLET | Freq: Two times a day (BID) | ORAL | 0 refills | Status: AC

## 2024-02-18 MED ORDER — OXYBUTYNIN CHLORIDE 5 MG PO TABS: 5.0000 mg | ORAL_TABLET | Freq: Three times a day (TID) | ORAL | 1 refills | Status: AC | PRN

## 2024-02-18 MED ORDER — OXYCODONE HCL 5 MG/5ML PO SOLN: 5.0000 mg | Freq: Once | ORAL | Status: DC | PRN

## 2024-02-18 MED ORDER — PROPOFOL 10 MG/ML IV BOLUS
INTRAVENOUS | Status: DC | PRN
  Administered 2024-02-18: 200 mg via INTRAVENOUS

## 2024-02-18 MED ORDER — ORAL CARE MOUTH RINSE: 15.0000 mL | Freq: Once | OROMUCOSAL | Status: AC

## 2024-02-18 SURGICAL SUPPLY — 20 items
BAG URO CATCHER STRL LF (MISCELLANEOUS) ×1 IMPLANT
BASKET ZERO TIP NITINOL 2.4FR (BASKET) IMPLANT
CATH URETERAL DUAL LUMEN 10F (MISCELLANEOUS) IMPLANT
CATH URETL OPEN 5X70 (CATHETERS) ×1 IMPLANT
CLOTH BEACON ORANGE TIMEOUT ST (SAFETY) ×1 IMPLANT
EXTRACTOR STONE NITINOL NGAGE (UROLOGICAL SUPPLIES) IMPLANT
FIBER LASER MOSES 200 DFL (Laser) IMPLANT
FIBER LASER MOSES 365 DFL (Laser) IMPLANT
GLOVE SURG LX STRL 7.5 STRW (GLOVE) ×1 IMPLANT
GOWN STRL SURGICAL XL XLNG (GOWN DISPOSABLE) ×1 IMPLANT
GUIDEWIRE STR DUAL SENSOR (WIRE) IMPLANT
GUIDEWIRE ZIPWRE .038 STRAIGHT (WIRE) ×1 IMPLANT
KIT TURNOVER KIT A (KITS) IMPLANT
MANIFOLD NEPTUNE II (INSTRUMENTS) ×1 IMPLANT
PACK CYSTO (CUSTOM PROCEDURE TRAY) ×1 IMPLANT
SHEATH NAVIGATOR HD 11/13X36 (SHEATH) IMPLANT
STENT URET 6FRX24 CONTOUR (STENTS) IMPLANT
STENT URET 6FRX26 CONTOUR (STENTS) IMPLANT
TUBING CONNECTING 10 (TUBING) ×1 IMPLANT
TUBING UROLOGY SET (TUBING) ×1 IMPLANT

## 2024-02-18 NOTE — Op Note (Signed)
 Operative Note  Preoperative diagnosis:  1.  11 mm left renal stone 2.  5 mm right renal stone  Postoperative diagnosis: Same  Procedure(s): 1.  Cystoscopy with bilateral ureteroscopy, holmium laser lithotripsy and bilateral ureteral stent placement 2.  Bilateral retrograde pyelograms with intraoperative interpretation fluoroscopic imaging  Surgeon: Rhoderick Moody, MD  Assistants:  None  Anesthesia:  General  Complications:  None  EBL: 10 mL  Specimens: 1.  None  Drains/Catheters: 1.  Bilateral 6 French, 24 cm JJ stents without tethers  Intraoperative findings:   Solitary left collecting system with no filling defects or dilation involving the left ureter or left renal pelvis seen on retrograde pyelogram Solitary right collecting system with a slightly stenotic area measuring approximately 3 to 4 cm involving the distal aspects of the right ureter.  There was no proximal ureteral dilation or other filling defects seen within the renal pelvis or its associated calyces. Slightly stenotic 3 to 4 cm segment of the right distal ureter that would not accommodate the 13 French ureteral access sheath No intravesical or urethral abnormalities were seen Excellent bilateral stone fragmentation following laser lithotripsy  Indication:  Joshua Smith is a 48 y.o. male with bilateral renal stones seen on recent evaluation for hematuria.  He has been consented for the above procedures, voices understanding wishes to proceed.  Description of procedure:  After informed consent was obtained, the patient was brought to the operating room and general LMA anesthesia was administered. The patient was then placed in the dorsolithotomy position and prepped and draped in the usual sterile fashion. A timeout was performed. A 23 French rigid cystoscope was then inserted into the urethral meatus and advanced into the bladder under direct vision. A complete bladder survey revealed no  intravesical pathology.  A 5 French ureteral catheter was then inserted into the left ureteral orifice and a retrograde pyelogram was obtained, with the findings listed above.  A Glidewire was then used to intubate the lumen of the ureteral catheter and was advanced up to the left renal pelvis, under fluoroscopic guidance.  The catheter was then removed, leaving the wire in place.  An additional sensor wire was then advanced up the left ureter to the left renal pelvis, or fluoroscopic guidance.  The 39 French inner cannula of the ureteral access sheath was then advanced over the sensor wire and into the proximal aspects of the left ureter.  The inner cannula was then removed, leaving the wire in place.  A 5 cm, 11 French/13 French ureteral access sheath was then advanced over the sensor wire and into good position within the proximal aspects of the left ureter.  A flexible ureteroscope was then advanced through the ureteral access sheath and into the renal pelvis where his large 11 mm lower pole renal stone was identified.  A 0 tip basket was then used to extract the lower pole stone and replaced into an upper pole calyx.  A 200 m holmium laser was then used to dust the stone into less than 2 mm fragments.  The flexible ureteroscope and ureteral access sheath were then removed under direct vision, identifying no evidence of ureteral trauma or luminal stone burden.  A 6 French, 24 cm JJ stent was then advanced over the Glidewire and into good position within the left collecting system, confirming placement via fluoroscopy.  A 5 French ureteral catheter was then inserted into the right ureteral orifice and a retrograde pyelogram was obtained, with the findings listed above.  A Glidewire was then used to intubate the lumen of the ureteral catheter and was advanced up to the right renal pelvis, under fluoroscopic guidance.  The catheter was then removed, leaving the wire in place.  An additional sensor wire was  then advanced up the right ureter to the right renal pelvis, or fluoroscopic guidance.  I was able to advance the [place] inner cannula of the ureteral access sheath up to the proximal aspect of the right ureter.  I then attempted to advance the 13 French ureteral access sheath over the sensor wire, but could not advance it past the distal aspects of the right ureter.  I then removed the sensor wire and advanced the ureteroscope alongside the Glidewire up to the right renal pelvis.  A full inspection of the right renal pelvis was unremarkable aside from his 5 mm midpole stone.  200 m holmium laser was then used to fracture the stone into less than 2 mm fragments.  The flexible ureteroscope was then removed under direct vision, identifying no luminal stone burden or evidence of ureteral trauma.  A 6 French, 24 cm JJ stent was then advanced over the Glidewire and into good position within the right collecting system, confirming placement via fluoroscopy.  The patient's bladder was drained.  He tolerated procedure well and was transferred to the postanesthesia in stable condition.  Plan: Follow-up in 1 week for an in-office KUB and cystoscopy with bilateral stent removal with nitrous.

## 2024-02-18 NOTE — Addendum Note (Signed)
 Addendum  created 02/18/24 1134 by Dennison Nancy, CRNA   Child order released for a procedure order, Clinical Note Signed, Intraprocedure Blocks edited, LDA created via procedure documentation, SmartForm saved

## 2024-02-18 NOTE — Anesthesia Postprocedure Evaluation (Signed)
 Anesthesia Post Note  Patient: Joshua Smith  Procedure(s) Performed: CYSTOSCOPY/ BILATERAL RETROGRADE PYELOGRAM/ URETEROSCOPY/HOLMIUM LASER/STENT PLACEMENT (Bilateral: Pelvis)     Patient location during evaluation: PACU Anesthesia Type: General Level of consciousness: awake and alert Pain management: pain level controlled Vital Signs Assessment: post-procedure vital signs reviewed and stable Respiratory status: spontaneous breathing, nonlabored ventilation, respiratory function stable and patient connected to nasal cannula oxygen Cardiovascular status: blood pressure returned to baseline and stable Postop Assessment: no apparent nausea or vomiting Anesthetic complications: no   No notable events documented.  Last Vitals:  Vitals:   02/18/24 0951 02/18/24 1000  BP: 116/77 118/74  Pulse: (!) 53 (!) 49  Resp: 12 15  Temp: 36.4 C   SpO2: 100% 100%    Last Pain:  Vitals:   02/18/24 1000  TempSrc:   PainSc: 0-No pain                 Lajuana Patchell

## 2024-02-18 NOTE — H&P (Signed)
 Urology Preoperative H&P   Chief Complaint: Bilateral renal stones  History of Present Illness: Joshua Smith is a 48 y.o. male with bilateral renal stones and is here today for ureteroscopy for definitive treatment.  He reports no GU complaints this morning.    Past Medical History:  Diagnosis Date   Epididymitis    intermittent   History of kidney stones    Panic attacks    situational    Past Surgical History:  Procedure Laterality Date   COLONOSCOPY     POLYPECTOMY     VASECTOMY     WISDOM TOOTH EXTRACTION      Allergies:  Allergies  Allergen Reactions   Cephalexin Hives   Penicillins Hives    Family History  Problem Relation Age of Onset   Colon cancer Maternal Grandfather        dx in 58's   Colon polyps Mother    Atrial fibrillation Father    Colon polyps Father    Pulmonary embolism Father    Arrhythmia Paternal Grandmother    CVA Paternal Grandmother    Heart attack Paternal Grandfather 56   Esophageal cancer Neg Hx    Rectal cancer Neg Hx    Stomach cancer Neg Hx     Social History:  reports that he has never smoked. He has never used smokeless tobacco. He reports current alcohol use of about 8.0 standard drinks of alcohol per week. He reports that he does not use drugs.  ROS: A complete review of systems was performed.  All systems are negative except for pertinent findings as noted.  Physical Exam:  Vital signs in last 24 hours: Temp:  [98 F (36.7 C)] 98 F (36.7 C) (03/11 0611) Pulse Rate:  [48] 48 (03/11 0611) Resp:  [18] 18 (03/11 0611) BP: (121)/(81) 121/81 (03/11 0611) SpO2:  [98 %] 98 % (03/11 0611) Weight:  [86.2 kg] 86.2 kg (03/11 9604) Constitutional:  Alert and oriented, No acute distress Respiratory: Normal respiratory effort Psychiatric: Normal mood and affect  Laboratory Data:  No results for input(s): "WBC", "HGB", "HCT", "PLT" in the last 72 hours.  No results for input(s): "NA", "K", "CL", "GLUCOSE", "BUN",  "CALCIUM", "CREATININE" in the last 72 hours.  Invalid input(s): "CO3"   No results found for this or any previous visit (from the past 24 hours). No results found for this or any previous visit (from the past 240 hours).  Renal Function: No results for input(s): "CREATININE" in the last 168 hours. CrCl cannot be calculated (No successful lab value found.).  Radiologic Imaging: No results found.  I independently reviewed the above imaging studies.  Assessment and Plan Joshua Smith is a 48 y.o. male with bilateral renal stones   The risks, benefits and alternatives of cystoscopy with BILATERAL ureteroscopy, laser lithotripsy and ureteral stent placement was discussed the patient.  Risks included, but are not limited to: bleeding, urinary tract infection, ureteral injury/avulsion, ureteral stricture formation, retained stone fragments, the possibility that multiple surgeries may be required to treat the stone(s), MI, stroke, PE and the inherent risks of general anesthesia.  The patient voices understanding and wishes to proceed.      Rhoderick Moody, MD 02/18/2024, 7:32 AM  [facility] Pager: [phone]

## 2024-02-18 NOTE — Anesthesia Procedure Notes (Signed)
 Procedure Name: LMA Insertion Date/Time: 02/18/2024 7:46 AM  Performed by: Dennison Nancy, CRNAPre-anesthesia Checklist: Patient identified, Emergency Drugs available, Suction available, Patient being monitored and Timeout performed Patient Re-evaluated:Patient Re-evaluated prior to induction Oxygen Delivery Method: Circle system utilized Preoxygenation: Pre-oxygenation with 100% oxygen Induction Type: IV induction Ventilation: Mask ventilation without difficulty LMA: LMA with gastric port inserted and LMA inserted Number of attempts: 1 Placement Confirmation: positive ETCO2, CO2 detector and breath sounds checked- equal and bilateral Tube secured with: Tape Dental Injury: Teeth and Oropharynx as per pre-operative assessment

## 2024-02-18 NOTE — Transfer of Care (Signed)
 Immediate Anesthesia Transfer of Care Note  Patient: Joshua Smith  Procedure(s) Performed: CYSTOSCOPY/ BILATERAL RETROGRADE PYELOGRAM/ URETEROSCOPY/HOLMIUM LASER/STENT PLACEMENT (Bilateral: Pelvis)  Patient Location: PACU  Anesthesia Type:General  Level of Consciousness: awake, alert , oriented, and patient cooperative  Airway & Oxygen Therapy: Patient Spontanous Breathing and Patient connected to face mask oxygen  Post-op Assessment: Report given to RN and Post -op Vital signs reviewed and stable  Post vital signs: Reviewed and stable  Last Vitals:  Vitals Value Taken Time  BP 116/77 02/18/24 0951  Temp 36.4 C 02/18/24 0951  Pulse 47 02/18/24 0955  Resp 12 02/18/24 0955  SpO2 100 % 02/18/24 0955  Vitals shown include unfiled device data.  Last Pain:  Vitals:   02/18/24 0951  TempSrc:   PainSc: 0-No pain         Complications: No notable events documented.

## 2024-02-19 ENCOUNTER — Encounter (HOSPITAL_COMMUNITY): Payer: Self-pay | Admitting: Urology

## 2024-02-25 ENCOUNTER — Other Ambulatory Visit: Payer: Self-pay | Admitting: Urology

## 2024-02-25 DIAGNOSIS — R310 Gross hematuria: Secondary | ICD-10-CM | POA: Diagnosis not present

## 2024-02-25 DIAGNOSIS — N281 Cyst of kidney, acquired: Secondary | ICD-10-CM | POA: Diagnosis not present

## 2024-02-25 DIAGNOSIS — N202 Calculus of kidney with calculus of ureter: Secondary | ICD-10-CM | POA: Diagnosis not present

## 2024-02-26 ENCOUNTER — Other Ambulatory Visit: Payer: Self-pay | Admitting: Urology

## 2024-02-26 NOTE — Progress Notes (Signed)
 Patient phoned to give updated information on surgery.  Date of Surgery - 02-28-24  Arrival Time - 1:15 PM and check in at admitting.    NPO Status - patient reminded to not eat solid food or liquids after midnight.    Medications morning of surgery - If needed Tylenol, Oxycodone, Ondansetron, Oxybutynin and Pyridium  No change in medical history, allergies per patient.  Transportation home - Kathleen Lik[facility] [phone]  All questions answered and patient's wife stated understanding

## 2024-02-28 ENCOUNTER — Encounter: Admission: RE | Payer: Self-pay | Source: Home / Self Care

## 2024-02-28 ENCOUNTER — Ambulatory Visit (HOSPITAL_COMMUNITY): Admission: RE | Admit: 2024-02-28 | Source: Home / Self Care | Admitting: Urology

## 2024-02-28 DIAGNOSIS — N289 Disorder of kidney and ureter, unspecified: Secondary | ICD-10-CM

## 2024-02-28 DIAGNOSIS — N200 Calculus of kidney: Secondary | ICD-10-CM | POA: Diagnosis not present

## 2024-02-28 SURGERY — CYSTOSCOPY/URETEROSCOPY/HOLMIUM LASER/STENT PLACEMENT
Anesthesia: General | Laterality: Bilateral

## 2024-05-21 DIAGNOSIS — N281 Cyst of kidney, acquired: Secondary | ICD-10-CM | POA: Diagnosis not present

## 2024-05-21 DIAGNOSIS — N200 Calculus of kidney: Secondary | ICD-10-CM | POA: Diagnosis not present

## 2024-11-02 DIAGNOSIS — R21 Rash and other nonspecific skin eruption: Secondary | ICD-10-CM | POA: Diagnosis not present

## 2024-11-02 DIAGNOSIS — B029 Zoster without complications: Secondary | ICD-10-CM | POA: Diagnosis not present

## 2024-11-24 DIAGNOSIS — R82998 Other abnormal findings in urine: Secondary | ICD-10-CM | POA: Diagnosis not present
